# Patient Record
Sex: Female | Born: 1975 | Race: Black or African American | Hispanic: No | Marital: Single | Smoking: Never smoker
Health system: Southern US, Community
[De-identification: ages and names within clinical notes are randomized; demographics above are authoritative.]

## PROBLEM LIST (undated history)

## (undated) DIAGNOSIS — Z9889 Other specified postprocedural states: Secondary | ICD-10-CM

## (undated) DIAGNOSIS — O00109 Unspecified tubal pregnancy without intrauterine pregnancy: Secondary | ICD-10-CM

## (undated) DIAGNOSIS — R112 Nausea with vomiting, unspecified: Secondary | ICD-10-CM

## (undated) DIAGNOSIS — R7303 Prediabetes: Secondary | ICD-10-CM

## (undated) HISTORY — PX: TONSILLECTOMY: SUR1361

---

## 1997-07-30 DIAGNOSIS — J309 Allergic rhinitis, unspecified: Secondary | ICD-10-CM | POA: Insufficient documentation

## 1997-12-17 ENCOUNTER — Emergency Department (HOSPITAL_COMMUNITY): Admission: EM | Admit: 1997-12-17 | Discharge: 1997-12-17 | Payer: Self-pay | Admitting: Emergency Medicine

## 1998-03-12 ENCOUNTER — Other Ambulatory Visit: Admission: RE | Admit: 1998-03-12 | Discharge: 1998-03-12 | Payer: Self-pay | Admitting: Internal Medicine

## 1999-03-11 ENCOUNTER — Other Ambulatory Visit: Admission: RE | Admit: 1999-03-11 | Discharge: 1999-03-11 | Payer: Self-pay | Admitting: Family Medicine

## 1999-08-28 ENCOUNTER — Encounter: Payer: Self-pay | Admitting: *Deleted

## 1999-08-28 ENCOUNTER — Inpatient Hospital Stay (HOSPITAL_COMMUNITY): Admission: EM | Admit: 1999-08-28 | Discharge: 1999-08-28 | Payer: Self-pay | Admitting: *Deleted

## 1999-10-29 ENCOUNTER — Other Ambulatory Visit: Admission: RE | Admit: 1999-10-29 | Discharge: 1999-10-29 | Payer: Self-pay | Admitting: Obstetrics

## 2000-05-03 ENCOUNTER — Inpatient Hospital Stay (HOSPITAL_COMMUNITY): Admission: AD | Admit: 2000-05-03 | Discharge: 2000-05-05 | Payer: Self-pay | Admitting: Obstetrics

## 2001-06-26 ENCOUNTER — Other Ambulatory Visit: Admission: RE | Admit: 2001-06-26 | Discharge: 2001-06-26 | Payer: Self-pay | Admitting: Family Medicine

## 2003-02-07 ENCOUNTER — Other Ambulatory Visit: Admission: RE | Admit: 2003-02-07 | Discharge: 2003-02-07 | Payer: Self-pay | Admitting: Family Medicine

## 2004-05-28 ENCOUNTER — Emergency Department (HOSPITAL_COMMUNITY): Admission: EM | Admit: 2004-05-28 | Discharge: 2004-05-28 | Payer: Self-pay | Admitting: Emergency Medicine

## 2004-08-24 ENCOUNTER — Ambulatory Visit: Payer: Self-pay | Admitting: Internal Medicine

## 2004-08-31 ENCOUNTER — Ambulatory Visit: Payer: Self-pay | Admitting: Family Medicine

## 2004-08-31 ENCOUNTER — Encounter (INDEPENDENT_AMBULATORY_CARE_PROVIDER_SITE_OTHER): Payer: Self-pay | Admitting: Internal Medicine

## 2004-08-31 LAB — CONVERTED CEMR LAB: Pap Smear: NORMAL

## 2005-09-29 ENCOUNTER — Emergency Department (HOSPITAL_COMMUNITY): Admission: EM | Admit: 2005-09-29 | Discharge: 2005-09-29 | Payer: Self-pay | Admitting: Emergency Medicine

## 2006-01-19 ENCOUNTER — Inpatient Hospital Stay (HOSPITAL_COMMUNITY): Admission: AD | Admit: 2006-01-19 | Discharge: 2006-01-21 | Payer: Self-pay | Admitting: Obstetrics

## 2006-01-27 ENCOUNTER — Inpatient Hospital Stay (HOSPITAL_COMMUNITY): Admission: AD | Admit: 2006-01-27 | Discharge: 2006-01-27 | Payer: Self-pay | Admitting: Obstetrics

## 2006-01-31 ENCOUNTER — Inpatient Hospital Stay (HOSPITAL_COMMUNITY): Admission: AD | Admit: 2006-01-31 | Discharge: 2006-01-31 | Payer: Self-pay | Admitting: Obstetrics

## 2006-10-04 ENCOUNTER — Ambulatory Visit: Payer: Self-pay | Admitting: Internal Medicine

## 2007-03-03 ENCOUNTER — Telehealth (INDEPENDENT_AMBULATORY_CARE_PROVIDER_SITE_OTHER): Payer: Self-pay | Admitting: *Deleted

## 2007-03-07 ENCOUNTER — Encounter: Payer: Self-pay | Admitting: Internal Medicine

## 2008-01-08 ENCOUNTER — Emergency Department (HOSPITAL_COMMUNITY): Admission: EM | Admit: 2008-01-08 | Discharge: 2008-01-08 | Payer: Self-pay | Admitting: General Surgery

## 2008-03-20 ENCOUNTER — Emergency Department (HOSPITAL_COMMUNITY): Admission: EM | Admit: 2008-03-20 | Discharge: 2008-03-20 | Payer: Self-pay | Admitting: Emergency Medicine

## 2008-05-03 DIAGNOSIS — O00109 Unspecified tubal pregnancy without intrauterine pregnancy: Secondary | ICD-10-CM

## 2008-05-03 HISTORY — PX: ECTOPIC PREGNANCY SURGERY: SHX613

## 2008-05-03 HISTORY — DX: Unspecified tubal pregnancy without intrauterine pregnancy: O00.109

## 2009-01-20 ENCOUNTER — Encounter (INDEPENDENT_AMBULATORY_CARE_PROVIDER_SITE_OTHER): Payer: Self-pay | Admitting: Internal Medicine

## 2009-01-21 ENCOUNTER — Encounter: Payer: Self-pay | Admitting: Obstetrics & Gynecology

## 2009-01-21 ENCOUNTER — Encounter: Payer: Self-pay | Admitting: Emergency Medicine

## 2009-01-21 ENCOUNTER — Ambulatory Visit: Payer: Self-pay | Admitting: Obstetrics & Gynecology

## 2009-01-21 ENCOUNTER — Ambulatory Visit (HOSPITAL_COMMUNITY): Admission: AD | Admit: 2009-01-21 | Discharge: 2009-01-21 | Payer: Self-pay | Admitting: Obstetrics & Gynecology

## 2009-03-19 ENCOUNTER — Emergency Department (HOSPITAL_COMMUNITY): Admission: EM | Admit: 2009-03-19 | Discharge: 2009-03-20 | Payer: Self-pay | Admitting: Emergency Medicine

## 2009-03-24 ENCOUNTER — Emergency Department (HOSPITAL_COMMUNITY): Admission: EM | Admit: 2009-03-24 | Discharge: 2009-03-24 | Payer: Self-pay | Admitting: Emergency Medicine

## 2009-09-09 ENCOUNTER — Ambulatory Visit: Payer: Self-pay | Admitting: Physician Assistant

## 2009-09-12 ENCOUNTER — Telehealth: Payer: Self-pay | Admitting: Physician Assistant

## 2009-10-10 ENCOUNTER — Encounter: Payer: Self-pay | Admitting: Physician Assistant

## 2009-10-14 ENCOUNTER — Encounter: Payer: Self-pay | Admitting: Physician Assistant

## 2009-10-29 ENCOUNTER — Other Ambulatory Visit: Admission: RE | Admit: 2009-10-29 | Discharge: 2009-10-29 | Payer: Self-pay | Admitting: Internal Medicine

## 2009-10-29 ENCOUNTER — Ambulatory Visit: Payer: Self-pay | Admitting: Physician Assistant

## 2009-10-29 DIAGNOSIS — R82998 Other abnormal findings in urine: Secondary | ICD-10-CM | POA: Insufficient documentation

## 2009-10-29 LAB — CONVERTED CEMR LAB
Bilirubin Urine: NEGATIVE
Glucose, Urine, Semiquant: NEGATIVE
KOH Prep: NEGATIVE
Specific Gravity, Urine: 1.015
Whiff Test: NEGATIVE
pH: 5.5

## 2009-10-30 ENCOUNTER — Encounter: Payer: Self-pay | Admitting: Physician Assistant

## 2009-10-30 LAB — CONVERTED CEMR LAB
ALT: 21 units/L (ref 0–35)
AST: 21 units/L (ref 0–37)
Alkaline Phosphatase: 41 units/L (ref 39–117)
CO2: 24 meq/L (ref 19–32)
Casts: NONE SEEN /lpf
Chlamydia, DNA Probe: NEGATIVE
Creatinine, Ser: 0.71 mg/dL (ref 0.40–1.20)
Crystals: NONE SEEN
GC Probe Amp, Genital: NEGATIVE
Sodium: 140 meq/L (ref 135–145)
Total Bilirubin: 0.5 mg/dL (ref 0.3–1.2)
Total Protein: 6.9 g/dL (ref 6.0–8.3)

## 2009-11-11 DIAGNOSIS — R87610 Atypical squamous cells of undetermined significance on cytologic smear of cervix (ASC-US): Secondary | ICD-10-CM

## 2009-12-01 ENCOUNTER — Ambulatory Visit: Payer: Self-pay | Admitting: Physician Assistant

## 2009-12-01 LAB — CONVERTED CEMR LAB
Ketones, urine, test strip: NEGATIVE
Nitrite: NEGATIVE
Urobilinogen, UA: 0.2
WBC Urine, dipstick: NEGATIVE

## 2010-01-01 ENCOUNTER — Ambulatory Visit: Payer: Self-pay | Admitting: Physician Assistant

## 2010-01-01 ENCOUNTER — Other Ambulatory Visit: Admission: RE | Admit: 2010-01-01 | Discharge: 2010-01-01 | Payer: Self-pay | Admitting: Internal Medicine

## 2010-01-08 ENCOUNTER — Encounter: Payer: Self-pay | Admitting: Physician Assistant

## 2010-02-09 ENCOUNTER — Ambulatory Visit: Payer: Self-pay | Admitting: Physician Assistant

## 2010-02-09 DIAGNOSIS — J039 Acute tonsillitis, unspecified: Secondary | ICD-10-CM

## 2010-02-12 ENCOUNTER — Ambulatory Visit: Payer: Self-pay | Admitting: Physician Assistant

## 2010-03-04 ENCOUNTER — Ambulatory Visit: Payer: Self-pay | Admitting: Nurse Practitioner

## 2010-03-04 DIAGNOSIS — J029 Acute pharyngitis, unspecified: Secondary | ICD-10-CM

## 2010-03-04 DIAGNOSIS — G479 Sleep disorder, unspecified: Secondary | ICD-10-CM | POA: Insufficient documentation

## 2010-03-04 LAB — CONVERTED CEMR LAB: Rapid Strep: NEGATIVE

## 2010-03-20 ENCOUNTER — Ambulatory Visit: Payer: Self-pay | Admitting: Nurse Practitioner

## 2010-03-23 LAB — CONVERTED CEMR LAB
Eosinophils Relative: 3 % (ref 0–5)
HCT: 41.5 % (ref 36.0–46.0)
Hemoglobin: 14 g/dL (ref 12.0–15.0)
Lymphocytes Relative: 40 % (ref 12–46)
Lymphs Abs: 2.1 10*3/uL (ref 0.7–4.0)
MCV: 90.8 fL (ref 78.0–100.0)
Monocytes Absolute: 0.3 10*3/uL (ref 0.1–1.0)
RDW: 12.9 % (ref 11.5–15.5)
WBC: 5.3 10*3/uL (ref 4.0–10.5)

## 2010-06-04 NOTE — Miscellaneous (Signed)
Summary: Approved - Prior Authorization for Singulair  Clinical Lists Changes Approved prior authorization - Singulair 100 mg. #30 for one year.

## 2010-06-04 NOTE — Assessment & Plan Note (Signed)
Summary: Repeat Pap // tl   Vital Signs:  Patient profile:   35 year old female Height:      64 inches Weight:      150 pounds BMI:     25.84 Temp:     98.4 degrees F oral Pulse rate:   78 / minute Pulse rhythm:   regular Resp:     18 per minute BP sitting:   122 / 82  (left arm) Cuff size:   regular  Vitals Entered By: Armenia Shannon (January 01, 2010 11:13 AM) CC: repeat pap...Marland KitchenMarland KitchenMarland Kitchenpt wants to know why she keeps getting HPV... Pain Assessment Patient in pain? no       Does patient need assistance? Functional Status Self care Ambulation Normal   Primary Care Provider:  Tereso Newcomer, PA-C  CC:  repeat pap...Marland KitchenMarland KitchenMarland Kitchenpt wants to know why she keeps getting HPV....  History of Present Illness: Here for pap.  Had ASCUS.  But, no HPV test done.  Michele [name] woried she has HPV.  Reassurance today.   Problems Prior to Update: 1)  Pap Smear, Abnormal, Ascus  (ICD-795.01) 2)  Urinalysis, Abnormal  (ICD-791.9) 3)  Preventive Health Care  (ICD-V70.0) 4)  Contraceptive Management  (ICD-V25.09) 5)  Allergic Rhinitis  (ICD-477.9)  Current Medications (verified): 1)  Singulair 10 Mg  Tabs (Montelukast Sodium) .Marland Kitchen.. 1 Tab By Mouth Daily 2)  Flonase 50 Mcg/act Susp (Fluticasone Propionate) .... 2 Sprays Each Nostril Once Daily  Avoid Using For One Week Out of Every 4 Weeks. 3)  Zyrtec-D Allergy & Congestion 5-120 Mg Xr12h-Tab (Cetirizine-Pseudoephedrine) .... Take 1 Tablet By Mouth Two Times A Day As Needed For Allergies 4)  Ortho Evra 150-20 Mcg/24hr Ptwk (Norelgestromin-Eth Estradiol) .... Apply One Patch Per Week As Directed For 3 Weeks, Then One Week Off.  Allergies (verified): No Known Drug Allergies  Physical Exam  General:  alert, well-developed, and well-nourished.   Head:  normocephalic and atraumatic.   Genitalia:  normal introitus, no external lesions, no vaginal discharge, mucosa pink and moist, no vaginal or cervical lesions, no vaginal atrophy, no friaility or  hemorrhage, normal uterus size and position, and no adnexal masses or tenderness.   Neurologic:  alert & oriented X3 and cranial nerves II-XII intact.   Psych:  normally interactive.     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 1 tab by mouth daily 2)  Flonase 50 Mcg/act Susp (Fluticasone propionate) .... 2 sprays each nostril once daily  avoid using for one week out of every 4 weeks. 3)  Zyrtec-d Allergy & Congestion 5-120 Mg Xr12h-tab (Cetirizine-pseudoephedrine) .... Take 1 tablet by mouth two times a day as needed for allergies 4)  Ortho Evra 150-20 Mcg/24hr Ptwk (Norelgestromin-eth estradiol) .... Apply one patch per week as directed for 3 weeks, then one week off.  Patient Instructions: 1)  Please schedule a follow-up appointment in 1 year for CPP.

## 2010-06-04 NOTE — Progress Notes (Signed)
Summary: PA requirements for Singulair  Phone Note Outgoing Call   Summary of Call: Notify patient that I received request for PA on Singulair. I had advised her to try the zyrtec and nasal spray first. She should only try to fill the Singulair if the above does not work. I cannot get PA from Medicaid until she tries the above and notes that it does not work. Try the above for 4 weeks. Call if no better and I can try to get PA from Medicaid. Initial call taken by: Brynda Rim,  Sep 12, 2009 4:28 PM  Follow-up for Phone Call        Pt. advised of need to use Zyrtec and nasal spray for 4 weeks.  If ineffective, then try Singulair, advised of PA requirements.  C/o sore throat, instructed re increased fluid, saline spray and lozenges/cough drops for relief.  Dutch Quint RN  Sep 15, 2009 10:52 AM  Follow-up by: Dutch Quint RN,  Sep 15, 2009 10:52 AM     Appended Document: PA requirements for Singulair Left message on answering machine for pt. to return call re PA for Singulair.  Dutch Quint RN  October 13, 2009 10:06 AM   Pt. returned call. She continues to have problems with her allergies. Taking Zyrtec and Fluticasone with minimal relief. P.A. faxed Gaylyn Cheers RN  October 13, 2009 3:32 PM

## 2010-06-04 NOTE — Assessment & Plan Note (Signed)
Summary: Pharyngitis   Vital Signs:  Patient profile:   35 year old female LMP:     01/31/2010 Weight:      145.6 pounds BMI:     25.08 Temp:     97.4 degrees F oral Pulse rate:   70 / minute Pulse rhythm:   regular Resp:     16 per minute BP sitting:   100 / 64  (left arm) Cuff size:   regular  Vitals Entered By: Levon Hedger (March 04, 2010 11:01 AM)  Nutrition Counseling: Patient's BMI is greater than 25 and therefore counseled on weight management options. CC: sorethroat...has been treated with amoxicillin and still is not feeling any better, still hard to swallow Is Patient Diabetic? No Pain Assessment Patient in pain? yes     Location: throat Intensity: 3  Does patient need assistance? Functional Status Self care Ambulation Normal Comments pt states she is not taking any medcation. LMP (date): 01/31/2010     Enter LMP: 01/31/2010 Last PAP Result NEGATIVE FOR INTRAEPITHELIAL LESIONS OR MALIGNANCY.   Primary Care Provider:  Tereso Newcomer, PA-C  CC:  sorethroat...has been treated with amoxicillin and still is not feeling any better and still hard to swallow.  History of Present Illness:  Pt into the office with sore throat. She was seen on previous visit and she was treated with amoxil. Symptoms improved at that time and returned 3 nights ago. +difficulty eating and drinking +right ear with intermittent pain ? pain with wisdom tooth on right side -fever -cough symptoms worse when she drinks acidic juices such as orange juice  Family planning - pt states that she is not sexually active.  She did NOT start the birth control patch as ordered  Allergies (verified): No Known Drug Allergies  Review of Systems General:  Complains of sleep disorder; denies fever. ENT:  Complains of sore throat; denies ear discharge and nasal congestion. CV:  Denies chest pain or discomfort. Resp:  Denies cough. GI:  Denies abdominal pain, nausea, and  vomiting.  Physical Exam  General:  alert.   Head:  normocephalic.   Mouth:  tonsillar enlargment +2 pharynx pink and moist and fair dentition.   Lungs:  normal breath sounds.   Heart:  normal rate and regular rhythm.   Msk:  normal ROM.   Neurologic:  alert & oriented X3.     Impression & Recommendations:  Problem # 1:  PHARYNGITIS (ICD-462) rapid strep negative advised pt to start allergy meds take aleve as needed  start Nexium as symptoms may be from acid reflux (samples given) Orders: Rapid Strep ([address])  Problem # 2:  SLEEP DISORDER (ICD-780.50) advised benadryl or melatonin otc  Complete Medication List: 1)  Flonase 50 Mcg/act Susp (Fluticasone propionate) .... 2 sprays each nostril once daily  avoid using for one week out of every 4 weeks. 2)  Loratadine 10 Mg Tabs (Loratadine) .... One tablet by mouth daily 3)  Nexium 40 Mg Cpdr (Esomeprazole magnesium) .... One capsule by mouth before breakfast  Patient Instructions: 1)  Sore throat is most likely due to viral illness or acid reflux. 2)  Rapid strep is negative today so problem is not bacterial. 3)  Viral illness - Take Loratadine 10mg  by mouth daily  4)  Gargle with warm salt water 5)  Take advil or aleve as needed for swelling and pain 6)  Acid - read handout for foods to avoid. 7)  Take nexium 40mg  by mouth before breakfast (samples given)  8)  Follow up in 2-3 weeks to assess throat symptoms 9)  Will need flu vaccine Prescriptions: NEXIUM 40 MG CPDR (ESOMEPRAZOLE MAGNESIUM) One capsule by mouth before breakfast  #10 x 0   Entered and Authorized by:   Lehman Prom FNP   Signed by:   Lehman Prom FNP on 03/04/2010   Method used:   Samples Given   RxID:   [id number] LORATADINE 10 MG TABS (LORATADINE) One tablet by mouth daily  #30 x 1   Entered and Authorized by:   Lehman Prom FNP   Signed by:   Lehman Prom FNP on 03/04/2010   Method used:   Print then Give to Patient   RxID:    [id number]    Orders Added: 1)  Est. Patient Level III [84696] 2)  Rapid Strep [29528]    Laboratory Results  Date/Time Received: March 04, 2010 12:31 PM   Other Tests  Rapid Strep: negative

## 2010-06-04 NOTE — Letter (Signed)
Summary: *HSN Results Follow up  [facility]  [address] [place], [place] [address]   Phone: [phone]  Fax: [phone]      01/08/2010   Michele [name] [place] [address] [place], [place]  [address]   Dear  [name],                            ____S.Drinkard,FNP   ____D. Gore,FNP       ____B. McPherson,MD   ____V. Rankins,MD    ____E. Mulberry,MD    ____N. Daphine Deutscher, FNP  ____D. Reche Dixon, MD    ____K. Philipp Deputy, MD    __x__S. Alben Spittle, PA-C     This letter is to inform you that your recent test(s):  ___x____Pap Smear    _______Lab Test     _______X-ray    ___x____ is normal  _______ requires a medication change  _______ requires a follow-up lab visit  ___x____ requires a follow-up visit with your [name]   Comments: Repeat pap is normal.  So, you should have a repeat pap in one year.       _________________________________________________________ If you have any questions, please contact our office                     Sincerely,  Tereso Newcomer PA-C [facility]

## 2010-06-04 NOTE — Assessment & Plan Note (Signed)
Summary: CPP///KT   Vital Signs:  Patient profile:   35 year old female LMP:     10/17/2009 Weight:      148 pounds Temp:     97.9 degrees F oral Pulse rate:   58 / minute Pulse rhythm:   regular Resp:     18 per minute BP sitting:   96 / 64  (left arm) Cuff size:   regular  Vitals Entered By: Armenia Shannon (October 29, 2009 3:40 PM)  Primary Care Provider:  Tereso Newcomer, PA-C   History of Present Illness: Here for CPP.  Health maint: PHQ9=2 LMP 6.17.2011 Does have a h/o abnormal pap in past. No heavy bleeding or discharge. Wants to try the patch for [facility]. Has had a mammo. 2009. No FHx of breast, colon or ovarian cancer. Not sure why she had a mammo done in past.  No h/o lumps, etc. Does not take calcium.  Allergies:  Now on Singulair and Zyrtec D.  Doing well with less symptoms.   Problems Prior to Update: 1)  Urinalysis, Abnormal  (ICD-791.9) 2)  Preventive Health Care  (ICD-V70.0) 3)  Contraceptive Management  (ICD-V25.09) 4)  Allergic Rhinitis  (ICD-477.9)  Current Medications (verified): 1)  Singulair 10 Mg  Tabs (Montelukast Sodium) .Marland Kitchen.. 1 Tab By Mouth Daily 2)  Flonase 50 Mcg/act Susp (Fluticasone Propionate) .... 2 Sprays Each Nostril Once Daily  Avoid Using For One Week Out of Every 4 Weeks. 3)  Zyrtec-D Allergy & Congestion 5-120 Mg Xr12h-Tab (Cetirizine-Pseudoephedrine) .... Take 1 Tablet By Mouth Two Times A Day As Needed For Allergies  Allergies (verified): No Known Drug Allergies  Past History:  Past Medical History: Last updated: 03/07/2007 Current Problems:  CONTRACEPTIVE MANAGEMENT (ICD-V25.09) ALLERGIC RHINITIS (ICD-477.9)  Past Surgical History: Last updated: 09/09/2009 Caesarean section 2007 Ectopic Pregnancy s/p surgery 01/2009  Family History: Last updated: 10/29/2009 No breast, colon or ovarian cancer. 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 Kidney problems - mom CHF - mom CAD - mom had CABG in 47s Family History Hypertension  Review of Systems      See HPI General:  Denies chills and fever. CV:  Denies chest pain or discomfort and shortness of breath with exertion. Resp:  Denies cough. GI:  Denies bloody stools and dark tarry stools. GU:  Denies dysuria and urinary frequency. MS:  Denies joint pain. Derm:  Denies rash. Psych:  Denies depression. Endo:  Denies cold intolerance and heat intolerance. Heme:  Denies bleeding.  Physical Exam  General:  alert, well-developed, and well-nourished.   Head:  normocephalic and atraumatic.   Eyes:  pupils equal, pupils round, pupils reactive to light, and no optic disk abnormalities.   Ears:  R ear normal and L ear normal.   Nose:  no external deformity.   Mouth:  pharynx pink and moist.   Neck:  supple and no cervical lymphadenopathy.   Breasts:  skin/areolae normal, no masses, no abnormal thickening, no nipple discharge, no tenderness, and no adenopathy.   Lungs:  normal breath sounds, no crackles, and no wheezes.   Heart:  normal rate, regular rhythm, and no murmur.   Abdomen:  soft, non-tender, and no hepatomegaly.   Rectal:  no external abnormalities.   Genitalia:  normal introitus, no  external lesions, no vaginal discharge, mucosa pink and moist, no vaginal or cervical lesions, no vaginal atrophy, no friaility or hemorrhage, normal uterus size and position, and no adnexal masses or tenderness.   Msk:  normal ROM.   Pulses:  R posterior tibial normal, R dorsalis pedis normal, L posterior tibial normal, and L dorsalis pedis normal.   Extremities:  no edema Neurologic:  alert & oriented X3 and cranial nerves II-XII intact.   Skin:  turgor normal.   Psych:  normally interactive.     Impression &  Recommendations:  Problem # 1:  PREVENTIVE HEALTH CARE (ICD-V70.0)  Orders: KOH/ [place] [phone]) T-Pap Smear, Thin Prep [phone]) T- GC Chlamydia ([address]) T-HIV Antibody  (Reflex) [phone]) T-Syphilis Test (RPR) [phone]) UA Dipstick w/o Micro (manual) (81002) T-Comprehensive Metabolic Panel ([id number])  Problem # 2:  CONTRACEPTIVE MANAGEMENT (ICD-V25.09) was on Nuva Ring wants to try patch now explained to her that I would rec. she come off patch after she turns 34 due to increased risk of PE will have to discuss depo or lower dose estrogen pills at that time nonsmoker . . . may be able to use until 39  Problem # 3:  URINALYSIS, ABNORMAL (ICD-791.9)  Orders: T-Culture, Urine ([id number]) T- * Misc. Laboratory test [phone])  Complete Medication List: 1)  Singulair 10 Mg Tabs (Montelukast sodium) .Marland Kitchen.. 1 tab by mouth daily 2)  Flonase 50 Mcg/act Susp (Fluticasone propionate) .... 2 sprays each nostril once daily  avoid using for one week out of every 4 weeks. 3)  Zyrtec-d Allergy & Congestion 5-120 Mg Xr12h-tab (Cetirizine-pseudoephedrine) .... Take 1 tablet by mouth two times a day as needed for allergies 4)  Ortho Evra 150-20 Mcg/24hr Ptwk (Norelgestromin-eth estradiol) .... Apply one patch per week as directed for 3 weeks, then one week off.  Patient Instructions: 1)  Take Calcium 600 mg + Vitamin D 400 International Units two times a day. 2)  Start the patch the first Sunday after the first day of your next period.  If your period starts on a Saturday, start the patch the next day on Sunday.  If your period starts on Monday, start the patch the next Sunday. 3)  Wear for 3 weeks.  Change weekly.  Then, take a week off to have a cycle. 4)  Please schedule a follow-up appointment in 1 year with Lucresia Simic for CPP or sooner if needed. 5)  Schedule lab visit for fasting lipids. Prescriptions: ORTHO EVRA 150-20 MCG/24HR PTWK (NORELGESTROMIN-ETH ESTRADIOL) Apply one patch per  week as directed for 3 weeks, then one week off.  #1 mo supply x 11   Entered and Authorized by:   Tereso Newcomer PA-C   Signed by:   Tereso Newcomer PA-C on 10/29/2009   Method used:   Print then Give to Patient   RxID:   [id number]   Laboratory Results   Urine Tests    Routine Urinalysis   Glucose: negative   (Normal Range: Negative) Bilirubin: negative   (Normal Range: Negative) Ketone: negative   (Normal Range: Negative) Spec. Gravity: 1.015   (Normal Range: 1.003-1.035) Blood: trace-lysed   (Normal Range: Negative) pH: 5.5   (Normal Range: 5.0-8.0) Protein: negative   (Normal Range: Negative) Urobilinogen: 0.2   (Normal Range: 0-1) Nitrite: negative   (Normal Range: Negative) Leukocyte Esterace: trace   (Normal Range: Negative)      [place] Source: vaginal WBC/hpf: 1-5 Bacteria/hpf: rare Clue cells/hpf: none  Negative whiff Yeast/hpf: none [place] KOH:  Negative Trichomonas/hpf: none

## 2010-06-04 NOTE — Assessment & Plan Note (Signed)
SummaryBarbaraann Barthel PT//ALLERGIES   Vital Signs:  Patient profile:   35 year old female Weight:      151 pounds Temp:     97.4 degrees F oral Pulse rate:   71 / minute Pulse rhythm:   regular Resp:     18 per minute BP sitting:   116 / 80  (left arm) Cuff size:   regular  Vitals Entered By: Armenia Shannon (Sep 09, 2009 2:53 PM) CC: allergies,breathing issues/stoped-up,zertec and singular works together for the [employer] Is Patient Diabetic? No  Does patient need assistance? Functional Status Self care Ambulation Normal   Primary Care Provider:  Tereso Newcomer, PA-C  CC:  allergies, breathing issues/stoped-up, zertec and singular works together for the allergies, headaches, and itchy eyes.  History of Present Illness: Previous patient of Dr. Barbaraann Barthel . . . first meeting. Was seeing Dr. Gaynell Face after she had her baby in 2007.  Health maint: No pap a few years. No birth control at this time.  Used to have an IUD but was taken out when she had ectopic pregnancy.  Allergic rhinitis: C/o nasal congestion and drainage.  +itchy watery eyes; ocular discharge; + headaches. She was taking zyrtec and singulair recently.  She was getting from her aunt.  These seem to have helped the most.  No wheezing or dyspnea.    Habits & Providers  Alcohol-Tobacco-Diet     Tobacco Status: never  Exercise-Depression-Behavior     Drug Use: no  Allergies (verified): No Known Drug Allergies  Past History:  Past Medical History: Reviewed history from 03/07/2007 and no changes required. Current Problems:  CONTRACEPTIVE MANAGEMENT (ICD-V25.09) ALLERGIC RHINITIS (ICD-477.9)  Past Surgical History: Caesarean section 2007 Ectopic Pregnancy s/p surgery 01/2009  Family History: No breast, colon or ovarian cancer. 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 Resp:  Denies cough and wheezing.  Physical Exam  General:  alert, well-developed, and well-nourished.   Head:  normocephalic and atraumatic.   Eyes:  "allergic shiners" bilat pupils equal, pupils round, and pupils reactive to light.   Ears:  R ear normal and L ear normal.   Nose:  no external deformity and mucosal edema.   Mouth:  pharynx pink and moist.   Neck:  supple and no cervical lymphadenopathy.   Lungs:  normal breath sounds and no wheezes.   Heart:  normal rate and regular rhythm.   Neurologic:  alert & oriented X3 and cranial nerves II-XII intact.   Psych:  normally interactive.     Impression & Recommendations:  Problem # 1:  ALLERGIC RHINITIS (ICD-477.9) try flonase and zyrtec-D if no improvement ok to get singulair and add to regimen (rx given)  The following medications were removed from the medication list:    Allegra 180 Mg Tabs (Fexofenadine hcl) .Marland Kitchen... 1 tab by mouth once daily Her updated medication list for this problem includes:    Flonase 50 Mcg/act Susp (Fluticasone propionate) .Marland Kitchen... 2 sprays each nostril once daily  avoid using for one week out of every 4 weeks.  Problem # 2:  Preventive Health Care (ICD-V70.0) schedule CPP  Complete Medication List: 1)  Singulair 10 Mg Tabs (Montelukast sodium) .Marland Kitchen.. 1 tab by mouth daily 2)  Flonase 50  Mcg/act Susp (Fluticasone propionate) .... 2 sprays each nostril once daily  avoid using for one week out of every 4 weeks. 3)  Zyrtec-d Allergy & Congestion 5-120 Mg Xr12h-tab (Cetirizine-pseudoephedrine) .... Take 1 tablet by mouth two times a day as needed for allergies  Patient Instructions: 1)  Please schedule a follow-up appointment in 2 months with Ronni Osterberg for CPP.  Come fasting (nothing to eat or drink after midnight the night before except water). 2)  Start on the Flonase and  Zyrtec-D now.  In 3-4 weeks, if your allergies are not improved, go ahead and fill the Singulair and start that in addition to the other medicines.   3)  Take a week off of the Flonase every 4 weeks (3 weeks on and one week off). Prescriptions: SINGULAIR 10 MG  TABS (MONTELUKAST SODIUM) 1 tab by mouth daily  #30 x 5   Entered and Authorized by:   Tereso Newcomer PA-C   Signed by:   Tereso Newcomer PA-C on 09/09/2009   Method used:   Print then Give to Patient   RxID:   [id number] ZYRTEC-D ALLERGY & CONGESTION 5-120 MG XR12H-TAB (CETIRIZINE-PSEUDOEPHEDRINE) Take 1 tablet by mouth two times a day as needed for allergies  #60 x 5   Entered and Authorized by:   Tereso Newcomer PA-C   Signed by:   Tereso Newcomer PA-C on 09/09/2009   Method used:   Print then Give to Patient   RxID:   [id number] FLONASE 50 MCG/ACT SUSP (FLUTICASONE PROPIONATE) 2 sprays each nostril once daily  Avoid using for one week out of every 4 weeks.  #1 x 5   Entered and Authorized by:   Tereso Newcomer PA-C   Signed by:   Tereso Newcomer PA-C on 09/09/2009   Method used:   Print then Give to Patient   RxID:   [id number]

## 2010-06-04 NOTE — Letter (Signed)
Summary: Handout Printed  Printed Handout:  - Human Papilloma Virus, (HPV)

## 2010-06-04 NOTE — Assessment & Plan Note (Signed)
Summary: RECHECK TROAT PER P. Merissa Renwick / NS   Vital Signs:  Patient profile:   35 year old female Pulse rate:   68 / minute Pulse rhythm:   regular Resp:     20 per minute BP sitting:   108 / 78  (right arm) Cuff size:   regular  Vitals Entered By: Dutch Quint RN (February 12, 2010 2:39 PM) CC: F/U tonsillitis Pain Assessment Patient in pain? no       Does patient need assistance? Functional Status Self care Ambulation Normal   Primary Care Provider:  Tereso Newcomer, PA-C  CC:  F/U tonsillitis.  History of Present Illness: In office 02/09/10 with symptoms of trouble swallowing, headache, aching, sore throat.  Dx tonsillitis.  Here for a f/u.  Still taking antibiotic and using magic mouthwash, no adverse effects except for some diarrhea, now resolved.  States she is currently not taking her allergy medications.  Allergies: No Known Drug Allergies  Review of Systems ENT:  Complains of difficulty swallowing, hoarseness, and sore throat; Feels like her throat is "hanging up" mucus.  Throat has been irritated at night, but not last night.. Resp:  Complains of cough; denies chest discomfort, chest pain with inspiration, and sputum productive.  Physical Exam  Mouth:  Bilateral tonsils still swollen, uvula deviated to the left tonsil.no exudates and pharyngeal erythema.   Lungs:  normal respiratory effort, no crackles, and R wheezes in upper posterior lobes.  States wheeze has been going on for a long time. Heart:  normal rate and regular rhythm.     Impression & Recommendations:  Problem # 1:  ACUTE TONSILLITIS (ICD-463) Seen by Wende Mott  Throat looks better, tonsils still enlarged To continue antibiotics until completed, magic mouthwash as needed Return if symptoms persist or worsen  Complete Medication List: 1)  Singulair 10 Mg Tabs (Montelukast sodium) .Marland Kitchen.. 1 tab by mouth daily 2)  Flonase 50 Mcg/act Susp (Fluticasone propionate) .... 2 sprays each nostril once daily   avoid using for one week out of every 4 weeks. 3)  Zyrtec-d Allergy & Congestion 5-120 Mg Xr12h-tab (Cetirizine-pseudoephedrine) .... Take 1 tablet by mouth two times a day as needed for allergies 4)  Ortho Evra 150-20 Mcg/24hr Ptwk (Norelgestromin-eth estradiol) .... Apply one patch per week as directed for 3 weeks, then one week off. 5)  Augmentin 875-125 Mg Tabs (Amoxicillin-pot clavulanate) .... Take 1 tablet by mouth two times a day for 7 days 6)  Magic Mouthwash  .... Swish and spit 5 ml every 4-6 hours as needed for sore throat 7)  Ibuprofen 800 Mg Tabs (Ibuprofen) .... Take 1 tablet by mouth three times a day with food for 3-4 days, then take as needed  Patient Instructions: 1)  Seen by Wende Mott 2)  Your throat looks much better. 3)  Complete antibiotics, even if you feel better. 4)  Continue to use magic mouthwash for comfort, as needed. 5)  If you feel like your symptoms are coming back, call for an appointment. 6)  Call if anything changes or if  you have any questions.   As above Tonsil size is down Redness almost resolved Much improved. Tereso Newcomer PA-C  February 13, 2010 3:35 PM

## 2010-06-04 NOTE — Assessment & Plan Note (Signed)
Summary: Tonsillitis   Vital Signs:  Patient profile:   35 year old female Height:      64 inches Weight:      146.5 pounds BMI:     25.24 Temp:     98.9 degrees F oral Pulse rate:   76 / minute Pulse rhythm:   regular Resp:     16 per minute BP sitting:   130 / 90  (left arm) Cuff size:   regular  Vitals Entered By: [place] Shannon (February 09, 2010 10:30 AM) CC: pt is here for sore throat.... Is Patient Diabetic? No Pain Assessment Patient in pain? no       Does patient need assistance? Functional Status Self care Ambulation Normal   Primary Care Provider:  Tereso Newcomer, PA-C  CC:  pt is here for sore throat.....  History of Present Illness: Sore throat since Sat. at 4pm.  + headache.  + myalgias.  Theraflu helped.  Symptoms started again yesterday morning.  No fever.  No cough.  Daughter said she felt hot.  Temp 98.1.  + chills.  + right otalgia. No n/v or diarrhea.  No chest pain or sob.    Problems Prior to Update: 1)  Acute Tonsillitis  (ICD-463) 2)  Pap Smear, Abnormal, Ascus  (ICD-795.01) 3)  Urinalysis, Abnormal  (ICD-791.9) 4)  Preventive Health Care  (ICD-V70.0) 5)  Contraceptive Management  (ICD-V25.09) 6)  Allergic Rhinitis  (ICD-477.9)  Current Medications (verified): 1)  Singulair 10 Mg  Tabs (Montelukast Sodium) .Marland Kitchen.. 1 Tab By Mouth Daily 2)  Flonase 50 Mcg/act Susp (Fluticasone Propionate) .... 2 Sprays Each Nostril Once Daily  Avoid Using For One Week Out of Every 4 Weeks. 3)  Zyrtec-D Allergy & Congestion 5-120 Mg Xr12h-Tab (Cetirizine-Pseudoephedrine) .... Take 1 Tablet By Mouth Two Times A Day As Needed For Allergies 4)  Ortho Evra 150-20 Mcg/24hr Ptwk (Norelgestromin-Eth Estradiol) .... Apply One Patch Per Week As Directed For 3 Weeks, Then One Week Off.  Allergies (verified): No Known Drug Allergies  Past History:  Past Medical History: Last updated: 03/07/2007 Current Problems:  CONTRACEPTIVE MANAGEMENT (ICD-V25.09) ALLERGIC RHINITIS  (ICD-477.9)  Physical Exam  General:  alert, well-developed, and well-nourished.   Head:  normocephalic and atraumatic.   Eyes:  pupils equal, pupils round, pupils reactive to light, and no injection.   Ears:  R ear normal and L ear normal.   Nose:  no external deformity.   Mouth:  tonsils 2+ bilat bilat tonsils erythematous . . . no exudate Neck:  very mild enlargement of post auricular nodes bilat Lungs:  normal breath sounds, no crackles, and no wheezes.   Heart:  normal rate and regular rhythm.   Neurologic:  alert & oriented X3 and cranial nerves II-XII intact.   Psych:  normally interactive.     Impression & Recommendations:  Problem # 1:  ACUTE TONSILLITIS (ICD-463)  tx with Augmentin x 7 days magic mouthwash ibuprofen three times a day x 3-4 days f/u later this week to check throat  Orders: Rapid Strep ([address]109)  Complete Medication List: 1)  Singulair 10 Mg Tabs (Montelukast sodium) .Marland Kitchen.. 1 tab by mouth daily 2)  Flonase 50 Mcg/act Susp (Fluticasone propionate) .... 2 sprays each nostril once daily  avoid using for one week out of every 4 weeks. 3)  Zyrtec-d Allergy & Congestion 5-120 Mg Xr12h-tab (Cetirizine-pseudoephedrine) .... Take 1 tablet by mouth two times a day as needed for allergies 4)  Ortho Evra 150-20 Mcg/24hr Ptwk (Norelgestromin-eth  estradiol) .... Apply one patch per week as directed for 3 weeks, then one week off. 5)  Augmentin 875-125 Mg Tabs (Amoxicillin-pot clavulanate) .... Take 1 tablet by mouth two times a day for 7 days 6)  Magic Mouthwash  .... Swish and spit 5 ml every 4-6 hours as needed for sore throat 7)  Ibuprofen 800 Mg Tabs (Ibuprofen) .... Take 1 tablet by mouth three times a day with food for 3-4 days, then take as needed  Patient Instructions: 1)  Take Augmentin until all gone. 2)  This will make your birth control pill ineffective (you could get pregnant).  You will need an alternate form of birth control until your next cycle starts.   3)  Use the Magic Mouthwash as needed for pain. 4)  Take the ibuprofen three times a day with food for 3-4 days straight.   5)  Stick to mainly liquids except for when you need to take your medicines.   6)  The prescriptions have been sent to the [facility]. 7)  Schedule Triage Nurse visit on Thursday or Friday of this week to recheck your throat.  Provider needs to see patient when she is here. 8)  Return sooner if feeling worse. 9)  Do not return to work until Wednesday, Oct 12. Prescriptions: IBUPROFEN 800 MG TABS (IBUPROFEN) Take 1 tablet by mouth three times a day with food for 3-4 days, then take as needed  #30 x 0   Entered and Authorized by:   Tereso Newcomer PA-C   Signed by:   Tereso Newcomer PA-C on 02/09/2010   Method used:   Faxed to ...       [facility] (retail)       [address] [place], [place]  [address]       Ph: [phone]       Fax: [phone]   RxID:   [id number] MAGIC MOUTHWASH swish and spit 5 mL every 4-6 hours as needed for sore throat  #100 mL x 0   Entered and Authorized by:   Tereso Newcomer PA-C   Signed by:   Tereso Newcomer PA-C on 02/09/2010   Method used:   Faxed to ...       [facility] (retail)       [address] [place], [place]  [address]       Ph: [phone]       Fax: [phone]   RxID:   [id number] AUGMENTIN 875-125 MG TABS (AMOXICILLIN-POT CLAVULANATE) Take 1 tablet by mouth two times a day for 7 days  #14 x 0   Entered and Authorized by:   Tereso Newcomer PA-C   Signed by:   Tereso Newcomer PA-C on 02/09/2010   Method used:   Faxed to ...       [facility] - [facility] (retail)       [address] [place], [place]  [address]       Ph: [phone]       Fax: [phone]   RxID:   [id number]

## 2010-06-04 NOTE — Progress Notes (Signed)
Summary: Office Visit//depression screening  Office Visit//depression screening   Imported By: Arta Bruce 11/04/2009 14:45:40  _____________________________________________________________________  External Attachment:    Type:   Image     Comment:   External Document

## 2010-06-04 NOTE — Medication Information (Signed)
Summary: RX Folder//SINGULAIR//APPROVED  RX Folder//SINGULAIR//APPROVED   Imported By: Arta Bruce 11/11/2009 15:10:13  _____________________________________________________________________  External Attachment:    Type:   Image     Comment:   External Document

## 2010-06-04 NOTE — Assessment & Plan Note (Signed)
Summary: F/U Sore throat   Vital Signs:  Michele [name] profile:   35 year old female Weight:      142.3 pounds BMI:     24.51 Temp:     97.6 degrees F oral Pulse rate:   72 / minute Pulse rhythm:   regular Resp:     16 per minute BP sitting:   100 / 66  (left arm) Cuff size:   regular  Vitals Entered By: Levon Hedger (March 20, 2010 12:22 PM) CC: follow-up visit sorethroat....pt states no pain but her throat still feels swollen, itchy, and irritated Is Michele [name] Diabetic? No Pain Assessment Michele [name] in pain? no       Does Michele [name] need assistance? Functional Status Self care Ambulation Normal   Primary Care Provider:  Tereso Newcomer, PA-C  CC:  follow-up visit sorethroat....pt states no pain but her throat still feels swollen, itchy, and and irritated.  History of Present Illness:  Pt into the office for f/u on sore throat. Educated on last visit about pharyngitis.  GERD - Pt reports that she has tried to decrease the irritating food and took the nexium as ordered.  Allergic Rhinitis - she has also taken the loratadine as ordered.  Pt still has question about causes for sore thoat -nausea -vomiting -exudate on tonsils -fever    Allergies (verified): No Known Drug Allergies  Review of Systems General:  Denies fever. ENT:  Complains of sore throat; denies earache; symptoms have improved. CV:  Denies fatigue. Resp:  Denies cough.  Physical Exam  General:  alert.   Head:  normocephalic.   Mouth:  tonsillar enlargement +2 no exudate uvula midline Lungs:  normal breath sounds.   Heart:  normal rate and regular rhythm.   Abdomen:  normal bowel sounds.   Neurologic:  alert & oriented X3.     Impression & Recommendations:  Problem # 1:  PHARYNGITIS (ICD-462) reviewed with pt advised to continue symptomatic management  Orders: T-CBC w/Diff ([id number])  Problem # 2:  ALLERGIC RHINITIS (ICD-477.9) continue current meds Her updated medication list for  this problem includes:    Flonase 50 Mcg/act Susp (Fluticasone propionate) .Marland Kitchen... 2 sprays each nostril once daily  avoid using for one week out of every 4 weeks.    Loratadine 10 Mg Tabs (Loratadine) ..... One tablet by mouth daily  Complete Medication List: 1)  Flonase 50 Mcg/act Susp (Fluticasone propionate) .... 2 sprays each nostril once daily  avoid using for one week out of every 4 weeks. 2)  Loratadine 10 Mg Tabs (Loratadine) .... One tablet by mouth daily 3)  Nexium 40 Mg Cpdr (Esomeprazole magnesium) .... One capsule by mouth before breakfast   Michele [name] Instructions: 1)  You have declined the flu vaccine today.  If you change your mind then you can schedule a visit with the triage nurse for the injection 2)  Continue current treatment for throat 3)  May need referral to ENT 4)  You will be notified of the lab results on Monday. If your white count is elevated then you will need antibiotics and it will be called into the pharmacy   Orders Added: 1)  Est. Michele [name] Level III [33295] 2)  T-CBC w/Diff [[id number]]    Prevention & Chronic Care Immunizations   Influenza vaccine: Refused  (03/20/2010)   Influenza vaccine deferral: Refused  (03/20/2010)    Tetanus booster: Not documented    Pneumococcal vaccine: Not documented  Other Screening   Pap smear: NEGATIVE FOR INTRAEPITHELIAL LESIONS  OR MALIGNANCY.  (01/01/2010)   Smoking status: never  (09/09/2009)

## 2010-08-07 LAB — CBC
HCT: 36.9 % (ref 36.0–46.0)
HCT: 38.5 % (ref 36.0–46.0)
Hemoglobin: 12.4 g/dL (ref 12.0–15.0)
Hemoglobin: 12.9 g/dL (ref 12.0–15.0)
MCHC: 33.6 g/dL (ref 30.0–36.0)
MCHC: 33.5 g/dL (ref 30.0–36.0)
MCV: 93.2 fL (ref 78.0–100.0)
MCV: 94.2 fL (ref 78.0–100.0)
Platelets: 244 K/uL (ref 150–400)
Platelets: 262 K/uL (ref 150–400)
RBC: 3.96 MIL/uL (ref 3.87–5.11)
RBC: 4.09 MIL/uL (ref 3.87–5.11)
RDW: 13.5 % (ref 11.5–15.5)
RDW: 13.4 % (ref 11.5–15.5)
WBC: 9.6 K/uL (ref 4.0–10.5)
WBC: 10.2 K/uL (ref 4.0–10.5)

## 2010-08-07 LAB — BASIC METABOLIC PANEL WITH GFR
BUN: 8 mg/dL (ref 6–23)
CO2: 26 meq/L (ref 19–32)
Calcium: 9 mg/dL (ref 8.4–10.5)
Chloride: 108 meq/L (ref 96–112)
Creatinine, Ser: 0.63 mg/dL (ref 0.4–1.2)
GFR calc non Af Amer: >60 mL/min
Glucose, Bld: 102 mg/dL — ABNORMAL HIGH (ref 70–99)
Potassium: 3.7 meq/L (ref 3.5–5.1)
Sodium: 140 meq/L (ref 135–145)

## 2010-08-07 LAB — DIFFERENTIAL
Basophils Relative: 0 % (ref 0–1)
Eosinophils Absolute: 0.1 10*3/uL (ref 0.0–0.7)
Monocytes Relative: 4 % (ref 3–12)
Neutrophils Relative %: 80 % — ABNORMAL HIGH (ref 43–77)

## 2010-08-07 LAB — POCT PREGNANCY, URINE: Preg Test, Ur: POSITIVE

## 2010-08-07 LAB — HCG, QUANTITATIVE, PREGNANCY

## 2010-08-07 LAB — TYPE AND SCREEN: Antibody Screen: NEGATIVE

## 2010-09-18 NOTE — Discharge Summary (Signed)
NAMECATRENA, VARI NO.:  [id number]   MEDICAL RECORD NO.:  [id number]          PATIENT TYPE:  INP   LOCATION:  9141                          FACILITY:  [facility]   PHYSICIAN:  Kathreen Cosier, M.D.DATE OF BIRTH:  12-Oct-1975   DATE OF ADMISSION:  01/19/2006  DATE OF DISCHARGE:  01/21/2006                                 DISCHARGE SUMMARY   The patient is a 35 year old gravida 4, para 3-0-0-3, [facility] February 06, 2006,  admitted with ruptured membranes.  She was 4 cm dilated with a breech  presentation.  The patient underwent low transverse cesarean section on  September 19.  She had a female, Apgars 8 and 9, weight 5 pounds 15 ounces.  The fluid was clear.  Postoperatively, the patient's hemoglobin was 8.1.  She was asymptomatic.  Her RPR was negative.  Platelets 172.  Urinalysis was  negative.  The patient desired early discharge and was discharged on the  second postoperative day on a regular diet and Tylox for pain.  The  remainder of her labs can be found in her prenatal record.  The patient is  to see me in six weeks.   DISCHARGE DIAGNOSIS:  Status post primary low transverse cesarean section  because of premature rupture of membranes, breech presentation, and labor.           ______________________________  Kathreen Cosier, M.D.     BAM/MEDQ  D:  02/09/2006  T:  02/10/2006  Job:  [id number]

## 2011-02-02 ENCOUNTER — Emergency Department (HOSPITAL_COMMUNITY)
Admission: EM | Admit: 2011-02-02 | Discharge: 2011-02-03 | Disposition: A | Discharge status: Home / Self Care | Payer: Medicaid Other | Attending: Emergency Medicine | Admitting: Emergency Medicine

## 2011-02-02 DIAGNOSIS — R209 Unspecified disturbances of skin sensation: Secondary | ICD-10-CM | POA: Insufficient documentation

## 2011-02-02 DIAGNOSIS — R51 Headache: Secondary | ICD-10-CM | POA: Insufficient documentation

## 2011-02-02 DIAGNOSIS — S139XXA Sprain of joints and ligaments of unspecified parts of neck, initial encounter: Secondary | ICD-10-CM | POA: Insufficient documentation

## 2011-02-02 DIAGNOSIS — M542 Cervicalgia: Secondary | ICD-10-CM | POA: Insufficient documentation

## 2011-02-03 ENCOUNTER — Emergency Department (HOSPITAL_COMMUNITY): Payer: Medicaid Other

## 2011-04-15 ENCOUNTER — Encounter (HOSPITAL_COMMUNITY): Payer: Self-pay | Admitting: *Deleted

## 2011-04-15 ENCOUNTER — Inpatient Hospital Stay (HOSPITAL_COMMUNITY)
Admission: AD | Admit: 2011-04-15 | Discharge: 2011-04-15 | Disposition: A | Discharge status: Home / Self Care | Payer: Medicaid Other | Source: Ambulatory Visit | Attending: Obstetrics & Gynecology | Admitting: Obstetrics & Gynecology

## 2011-04-15 DIAGNOSIS — A599 Trichomoniasis, unspecified: Secondary | ICD-10-CM

## 2011-04-15 DIAGNOSIS — A5901 Trichomonal vulvovaginitis: Secondary | ICD-10-CM | POA: Insufficient documentation

## 2011-04-15 DIAGNOSIS — R109 Unspecified abdominal pain: Secondary | ICD-10-CM | POA: Insufficient documentation

## 2011-04-15 HISTORY — DX: Unspecified tubal pregnancy without intrauterine pregnancy: O00.109

## 2011-04-15 LAB — WET PREP, GENITAL: Yeast Wet Prep HPF POC: NONE SEEN

## 2011-04-15 LAB — HCG, SERUM, QUALITATIVE: Preg, Serum: NEGATIVE

## 2011-04-15 LAB — CBC
MCH: 31.5 pg (ref 26.0–34.0)
MCHC: 34.4 g/dL (ref 30.0–36.0)
Platelets: 307 10*3/uL (ref 150–400)
RDW: 13.6 % (ref 11.5–15.5)

## 2011-04-15 LAB — URINALYSIS, ROUTINE W REFLEX MICROSCOPIC
Glucose, UA: NEGATIVE mg/dL
Ketones, ur: NEGATIVE mg/dL
Leukocytes, UA: NEGATIVE
pH: 5 (ref 5.0–8.0)

## 2011-04-15 LAB — POCT PREGNANCY, URINE: Preg Test, Ur: NEGATIVE

## 2011-04-15 MED ORDER — METRONIDAZOLE 500 MG PO TABS
2000.0000 mg | ORAL_TABLET | Freq: Once | ORAL | Status: AC
  Administered 2011-04-15: 2000 mg via ORAL
  Filled 2011-04-15: qty 4

## 2011-04-15 MED ORDER — IBUPROFEN 400 MG PO TABS
400.0000 mg | ORAL_TABLET | Freq: Once | ORAL | Status: AC
  Administered 2011-04-15: 400 mg via ORAL
  Filled 2011-04-15: qty 1

## 2011-04-15 NOTE — Progress Notes (Signed)
LMP Nov 7 x 3 days, 3 UPT at home neg, LLQ abd pain, and back pain,

## 2011-04-15 NOTE — Progress Notes (Signed)
Lower abd pain and back pain, 3 UPT negative at home, no periods

## 2011-08-19 ENCOUNTER — Emergency Department (HOSPITAL_COMMUNITY)
Admission: EM | Admit: 2011-08-19 | Discharge: 2011-08-19 | Disposition: A | Discharge status: Home / Self Care | Payer: Medicaid Other | Source: Home / Self Care | Attending: Emergency Medicine | Admitting: Emergency Medicine

## 2011-08-19 ENCOUNTER — Encounter (HOSPITAL_COMMUNITY): Payer: Self-pay | Admitting: Emergency Medicine

## 2011-08-19 DIAGNOSIS — J40 Bronchitis, not specified as acute or chronic: Secondary | ICD-10-CM

## 2011-08-19 MED ORDER — AZITHROMYCIN 250 MG PO TABS: 250.0000 mg | ORAL_TABLET | Freq: Every day | ORAL | Status: AC

## 2011-08-19 MED ORDER — GUAIFENESIN-CODEINE 100-10 MG/5ML PO SYRP: 5.0000 mL | ORAL_SOLUTION | Freq: Three times a day (TID) | ORAL | Status: AC | PRN

## 2011-08-19 NOTE — Discharge Instructions (Signed)
Bronchitis Bronchitis is a problem of the air tubes leading to your lungs. This problem makes it hard for air to get in and out of the lungs. You may cough a lot because your air tubes are narrow. Going without care can cause lasting (chronic) bronchitis. HOME CARE   Drink enough fluids to keep your pee (urine) clear or pale yellow.   Use a cool mist humidifier.   Quit smoking if you smoke. If you keep smoking, the bronchitis might not get better.   Only take medicine as told by your doctor.  GET HELP RIGHT AWAY IF:   Coughing keeps you awake.   You start to wheeze.   You become more sick or weak.   You have a hard time breathing or get short of breath.   You cough up blood.   Coughing lasts more than 2 weeks.   You have a fever.   Your baby is older than 3 months with a rectal temperature of 102 F (38.9 C) or higher.   Your baby is 3 months old or younger with a rectal temperature of 100.4 F (38 C) or higher.  MAKE SURE YOU:  Understand these instructions.   Will watch your condition.   Will get help right away if you are not doing well or get worse.  Document Released: 10/06/2007 Document Revised: 04/08/2011 Document Reviewed: 03/21/2009 ExitCare Patient Information 2012 [employer]. 

## 2011-08-19 NOTE — ED Provider Notes (Signed)
History     CSN: [id number]  Arrival date & time 08/19/11  1046   First MD Initiated Contact with Patient 08/19/11 1143      Chief Complaint  Patient presents with  . URI  . Bronchitis    (Consider location/radiation/quality/duration/timing/severity/associated sxs/prior treatment) HPI Comments: Having coughing for almost 2 weeks now specially at night and does and let you sleep occasionally have bring up phlegm the color greenish to yellowish and sometimes is just ride it is making my chest hurt. Take a deep breath also make my chest hurt. To have some mild congestion of my nose and stuffiness. In some sore throat. Have been taken multiple over-the-counter medicines and her Mucinex does seem to help may loosen up my chest phlegm but doesn't get rid of the cough. No wheezing and no shortness of breath at rest.  Patient is a 36 y.o. female presenting with URI. The history is provided by the patient.  URI The primary symptoms include sore throat, cough and abdominal pain. Primary symptoms do not include fever, wheezing or rash. The current episode started more than 1 week ago. This is a new problem. The problem has not changed since onset. The illness is not associated with chills.    Past Medical History  Diagnosis Date  . Ectopic pregnancy, tubal 2010    Past Surgical History  Procedure Date  . Ectopic pregnancy surgery 2010  . Cesarean section     Family History  Problem Relation Age of Onset  . Anesthesia problems Neg Hx     History  Substance Use Topics  . Smoking status: Never Smoker   . Smokeless tobacco: Never Used  . Alcohol Use: No    OB History    Grav Para Term Preterm Abortions TAB SAB Ect Mult Living   6 4 4  0 2 0 1 1 0 4      Review of Systems  Constitutional: Negative for fever, chills and activity change.  HENT: Positive for sore throat. Negative for neck pain and neck stiffness.   Eyes: Negative for itching.  Respiratory: Positive for cough.  Negative for chest tightness, shortness of breath and wheezing.   Gastrointestinal: Positive for abdominal pain.  Musculoskeletal: Negative for back pain.  Skin: Negative for color change and rash.    Allergies  Review of patient's allergies indicates no known allergies.  Home Medications   Current Outpatient Rx  Name Route Sig Dispense Refill  . AZITHROMYCIN 250 MG PO TABS Oral Take 1 tablet (250 mg total) by mouth daily. Take first 2 tablets together, then 1 every day until finished. 6 tablet 0  . GUAIFENESIN-CODEINE 100-10 MG/5ML PO SYRP Oral Take 5 mLs by mouth 3 (three) times daily as needed for cough. 120 mL 0    LMP 08/07/2011  Physical Exam  Nursing note and vitals reviewed. Constitutional: She appears well-developed and well-nourished.  Non-toxic appearance. She does not have a sickly appearance. She does not appear ill. No distress.  HENT:  Head: Normocephalic.  Right Ear: Tympanic membrane normal.  Left Ear: Tympanic membrane normal.  Nose: Nose normal.  Mouth/Throat: Mucous membranes are normal. Posterior oropharyngeal erythema present. No oropharyngeal exudate, posterior oropharyngeal edema or tonsillar abscesses.  Eyes: Conjunctivae are normal.  Neck: Normal range of motion. Neck supple.  Pulmonary/Chest: Effort normal and breath sounds normal. No respiratory distress. She has no decreased breath sounds. She has no wheezes. She has no rales. She exhibits no tenderness.  Abdominal: Soft.  Lymphadenopathy:  She has no cervical adenopathy.  Skin: No rash noted.    ED Course  Procedures (including critical care time)  Labs Reviewed - No data to display No results found.   1. Bronchitis       MDM  Patient with productive cough. For less than 2 weeks. Patient in no respiratory distress with isolated dry cough during exam. Good air movement and looks comfortable at rest. Afebrile. Recommended cough suppressant and macrolide coverage. Encouraged patient to  return in 5-7 days if no improvement is noted for [place] exam and perhaps x-rays if necessary or indicated. Patient agree with treatment plan and followup care as necessary        Jimmie Molly, MD 08/19/11 1251

## 2011-08-19 NOTE — ED Notes (Signed)
PT HERE WITH URI THAT STARTED X 2 WEEKS AGO BUT HAS WORSENED WITH  RIGHT CHEST PRESSURE AND DECREASE IN APPETITE.AFEBRILE.DENIES N/V/D.PT HAS BEEN TAKING OTC MUCINEX,AND COLD/COUGH MEDS BUT NO RELIEF

## 2011-08-31 ENCOUNTER — Telehealth (HOSPITAL_COMMUNITY): Payer: Self-pay | Admitting: *Deleted

## 2011-08-31 NOTE — ED Notes (Signed)
1210 Pt. called and said she had bronchitis and is still coughing. She asked what she should do?  I told her bronchitis can last 4-6 weeks.  The doctors usually recommend you come back if fever, SOB, chest pain or wheezing. I asked if she finished all of her medication. She said she has, but the cough medicine did not help at all. She told me she was on Robitussin with Codeine. I told her she can use Delsym for cough or Mucinex. She said she is taking Mucinex.  She said she does feel better, is up and moving around now.  Cough is worse at night. Cough prod. of clear sputum. No fever.  I told her would call her back. Discussed with Dr. Ladon Applebaum.  He said she should come back for a recheck, due to cough for almost 4 weeks. She may need a CXR.   I called pt. and told her what Dr. Ladon Applebaum recommended. Pt. voiced understanding. Vassie Moselle 08/31/2011

## 2012-04-27 ENCOUNTER — Encounter: Payer: Self-pay | Admitting: Obstetrics & Gynecology

## 2012-04-27 ENCOUNTER — Ambulatory Visit (INDEPENDENT_AMBULATORY_CARE_PROVIDER_SITE_OTHER): Payer: Medicaid Other | Admitting: Obstetrics & Gynecology

## 2012-04-27 VITALS — BP 103/72 | HR 63 | Temp 97.1°F | Ht 61.5 in | Wt 156.4 lb

## 2012-04-27 DIAGNOSIS — N871 Moderate cervical dysplasia: Secondary | ICD-10-CM

## 2012-04-27 NOTE — Patient Instructions (Signed)
Loop Electrosurgical Excision Procedure Loop electrosurgical excision procedure (LEEP) is the removal of a portion of the lower part of the uterus (cervix). The procedure is done when there are significantly abnormal cervical cell changes. Abnormal cell changes of the cervix can lead to cancer if left in place and untreated.  The LEEP procedure itself typically only takes a few minutes. Often, it may be done in your caregiver's office. The procedure is considered safe for those who wish to get pregnant or are trying to get pregnant. Only under rare circumstances should this procedure be done if you are pregnant. LET YOUR CAREGIVER KNOW ABOUT:  Whether you are pregnant or late for your last menstrual period.  Allergies to foods or medicines.  All the medicines you are taking includingherbs, eyedrops, and over-the-counter medicines, and creams.  Use of steroids (by mouth or creams).  Previous problems with anesthetics or numbing medicine.  Previous gynecological surgery.  History of blood clots or bleeding problems.  Any recent or current vaginal infections (herpes, sexually transmitted infections).  Other health problems. RISKS AND COMPLICATIONS  Bleeding.  Infection.  Injury to the vagina, bladder, or rectum.  Very rare obstruction of the cervical opening that causes problems during menstruation (cervical stenosis). BEFORE THE PROCEDURE  Do not take aspirin or blood thinners (anticoagulants) for 1 week before the procedure, or as told by your caregiver.  Eat a light meal before the procedure.  Ask your caregiver about changing or stopping your regular medicines.  You may be given a pain reliever 1 or 2 hours before the procedure. PROCEDURE   A tool (speculum) is placed in the vagina. This allows your caregiver to see the cervix.  An iodine stain is applied to the cervix to find the area of abnormal cells to be removed.  Medicine is injected to numb the cervix (local  anesthetic).   Electricity is passed through a thin wire loop which is then used to remove (cauterize) a small segment of the affected cervix.  Light electrocautery is used to seal any small blood vessels and prevent bleeding.  A paste may be applied to the cauterized area of the cervix to help prevent bleeding.  The tissue sample is sent to the lab. It is examined under the microscope. AFTER THE PROCEDURE  Have someone drive you home.  You may have slight to moderate cramping.  You may notice a black vaginal discharge from the paste used on the cervix to prevent bleeding. This is normal.  Watch for excessive bleeding. This requires immediate medical care.  Ask when your test results will be ready. Make sure you get your test results. Document Released: 07/10/2002 Document Revised: 07/12/2011 Document Reviewed: 09/29/2010 ExitCare Patient Information 2013 [employer].  

## 2012-04-27 NOTE — Progress Notes (Signed)
Patient ID: Michele [name], female   DOB: 07-19-1975, 36 y.o.   MRN: [id number] Patient's last menstrual period was 04/13/2012. 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 Offered LEEP and she viewed video education. The procedure and risks were explained. She will schedule procedure in approximately 2 weeks with me. Patient requests Mirena for contraception.   [name] 04/27/2012 1:56 PM

## 2012-05-02 ENCOUNTER — Other Ambulatory Visit: Payer: Self-pay | Admitting: Otolaryngology

## 2012-05-02 DIAGNOSIS — D490 Neoplasm of unspecified behavior of digestive system: Secondary | ICD-10-CM

## 2012-05-08 ENCOUNTER — Ambulatory Visit
Admission: RE | Admit: 2012-05-08 | Discharge: 2012-05-08 | Disposition: A | Discharge status: Home / Self Care | Payer: Medicaid Other | Source: Ambulatory Visit | Attending: Otolaryngology | Admitting: Otolaryngology

## 2012-05-08 DIAGNOSIS — D490 Neoplasm of unspecified behavior of digestive system: Secondary | ICD-10-CM

## 2012-05-08 MED ORDER — IOHEXOL 300 MG/ML  SOLN
75.0000 mL | Freq: Once | INTRAMUSCULAR | Status: AC | PRN
  Administered 2012-05-08: 75 mL via INTRAVENOUS

## 2012-05-11 ENCOUNTER — Ambulatory Visit (INDEPENDENT_AMBULATORY_CARE_PROVIDER_SITE_OTHER): Payer: Medicaid Other | Admitting: Obstetrics & Gynecology

## 2012-05-11 ENCOUNTER — Encounter: Payer: Self-pay | Admitting: Obstetrics & Gynecology

## 2012-05-11 ENCOUNTER — Other Ambulatory Visit (HOSPITAL_COMMUNITY)
Admission: RE | Admit: 2012-05-11 | Discharge: 2012-05-11 | Disposition: A | Discharge status: Home / Self Care | Payer: Medicaid Other | Source: Ambulatory Visit | Attending: Obstetrics & Gynecology | Admitting: Obstetrics & Gynecology

## 2012-05-11 VITALS — BP 121/77 | HR 60 | Temp 99.2°F | Ht 61.5 in | Wt 157.6 lb

## 2012-05-11 DIAGNOSIS — N871 Moderate cervical dysplasia: Secondary | ICD-10-CM | POA: Insufficient documentation

## 2012-05-11 DIAGNOSIS — Z01812 Encounter for preprocedural laboratory examination: Secondary | ICD-10-CM

## 2012-05-11 LAB — POCT PREGNANCY, URINE: Preg Test, Ur: NEGATIVE

## 2012-05-11 NOTE — Patient Instructions (Signed)
Loop Electrosurgical Excision Procedure  Care After  Refer to this sheet in the next few weeks. These instructions provide you with information on caring for yourself after your procedure. Your caregiver may also give you more specific instructions. Your treatment has been planned according to current medical practices, but problems sometimes occur. Call your caregiver if you have any problems or questions after your procedure.  HOME CARE INSTRUCTIONS   · Do not use tampons, douche, or have sexual intercourse for 2 weeks or as directed by your caregiver.  · Begin normal activities if you have no or minimal cramping or bleeding, unless directed otherwise by your caregiver.  · Take your temperature if you feel sick. Write down your temperature on paper, and tell your caregiver if you have a fever.  · Take all medicines as directed by your caregiver.  · Keep all your follow-up appointments and Pap tests as directed by your caregiver.  SEEK IMMEDIATE MEDICAL CARE IF:   · You have bleeding that is heavier or longer than a normal menstrual cycle.  · You have bleeding that is bright red.  · You have blood clots.  · You have a fever.  · You have increasing cramps or pain not relieved by medicine.  · You develop abdominal pain that does not seem to be related to the same area of earlier cramping and pain.  · You are lightheaded, unusually weak, or faint.  · You develop painful or bloody urination.  · You develop a bad smelling vaginal discharge.  MAKE SURE YOU:  · Understand these instructions.  · Will watch your condition.  · Will get help right away if you are not doing well or get worse.  Document Released: 12/31/2010 Document Revised: 07/12/2011 Document Reviewed: 12/31/2010  ExitCare® Patient Information ©2013 [employer].

## 2012-05-11 NOTE — Progress Notes (Signed)
Patient ID: ANAIH BRANDER, female   DOB: [birth year], 37 y.o.   MRN: [id number] Patient's last menstrual period was 04/15/2012. Scheduled for LEEP.  Patient identified, informed consent obtained, signed copy in chart, time out performed.  Pap smear and colposcopy reviewed.   Pap ASCUS  Colpo Biopsy CIN 1-2 ECC negative Teflon coated speculum with smoke evacuator placed.  Cervix visualized. Paracervical block placed.  medium size Fisher loop used to remove cone of cervix using blend of cut and cautery on LEEP machine.  Edges/Base cauterized with Ball.  Monsel's solution used for hemostasis.  Patient tolerated procedure well.  Patient given post procedure instructions.  Follow up in 6 months for repeat pap or as needed.   Reshanda Lewey 05/11/2012 3:59 PM

## 2012-05-15 ENCOUNTER — Telehealth: Payer: Self-pay | Admitting: *Deleted

## 2012-05-15 NOTE — Telephone Encounter (Signed)
Patient left a message to find out when she needs to come back in to be seen and also to see if Dr. Debroah Loop was going to prescribe her birthcontrol pills. In Dr. Brayton Layman note he states that patient should return in 6 months for repeat PAP and there is no mention of birth control.

## 2012-05-15 NOTE — Telephone Encounter (Signed)
Called pt and discussed her concern. She states that the paper she received after the LEEP procedure says she does not need to come back for 6 months. I stated that is correct. She will receive a phone call if her LEEP results are abnormal and will need a repeat Pap in 6 months. Pt then stated that she is wanting to have an IUD for birth control. Her LMP was 04/15/12 and she had unprotected sex once after that. I advised pt to schedule clinic appt for IUD insertion at the end of this month. She should abstain completely from intercourse until that appt. If she has had a period by the time of the appt, she may keep the appt and if her UPT is negative, the IUD can be inserted. If she does not get a period before her IUD appt, she will need to reschedule. Pt voiced understanding.

## 2012-05-16 ENCOUNTER — Encounter: Payer: Self-pay | Admitting: *Deleted

## 2012-05-17 ENCOUNTER — Other Ambulatory Visit: Payer: Self-pay | Admitting: Otolaryngology

## 2012-05-17 ENCOUNTER — Other Ambulatory Visit (HOSPITAL_COMMUNITY)
Admission: RE | Admit: 2012-05-17 | Discharge: 2012-05-17 | Disposition: A | Discharge status: Home / Self Care | Payer: Medicaid Other | Source: Ambulatory Visit | Attending: Otolaryngology | Admitting: Otolaryngology

## 2012-05-17 DIAGNOSIS — K119 Disease of salivary gland, unspecified: Secondary | ICD-10-CM | POA: Insufficient documentation

## 2012-06-05 ENCOUNTER — Ambulatory Visit (INDEPENDENT_AMBULATORY_CARE_PROVIDER_SITE_OTHER): Payer: Medicaid Other | Admitting: Obstetrics & Gynecology

## 2012-06-05 VITALS — BP 126/83 | HR 63 | Ht 61.5 in | Wt 158.4 lb

## 2012-06-05 DIAGNOSIS — Z30430 Encounter for insertion of intrauterine contraceptive device: Secondary | ICD-10-CM

## 2012-06-05 DIAGNOSIS — Z01812 Encounter for preprocedural laboratory examination: Secondary | ICD-10-CM

## 2012-06-05 LAB — POCT PREGNANCY, URINE: Preg Test, Ur: NEGATIVE

## 2012-06-05 MED ORDER — PARAGARD INTRAUTERINE COPPER IU IUD
1.0000 | INTRAUTERINE_SYSTEM | Freq: Once | INTRAUTERINE | Status: AC
  Administered 2012-06-05: 1 via INTRAUTERINE

## 2012-06-05 NOTE — Progress Notes (Signed)
Patient ID: Michele [name], female   DOB: [birth year], 37 y.o.   MRN: [id number] Patient identified, informed consent performed, signed copy in chart, time out was performed.  Urine pregnancy test negative.  Speculum placed in the vagina.  Cervix visualized.  Cleaned with Betadine x 2.  Grasped anteriourly with a single tooth tenaculum.  Uterus sounded to 9cm.  Paragard IUD placed per manufacturer's recommendations.  Strings trimmed to 3 cm.   Patient given post procedure instructions and Paragard care card with expiration date.  Patient is asked to check IUD strings periodically and follow up in 4-6 weeks for IUD check.  Reviewed result of LEEP 05/11/12, CIN 2, clear margins. Repeat pap with cotesting in 12 months   [name] 06/05/2012 3:50 PM

## 2012-06-05 NOTE — Patient Instructions (Signed)
Intrauterine Device Insertion Care After Refer to this sheet in the next few weeks. These instructions provide you with information on caring for yourself after your procedure. Your caregiver may also give you more specific instructions. Your treatment has been planned according to current medical practices, but problems sometimes occur. Call your caregiver if you have any problems or questions after your procedure. HOME CARE INSTRUCTIONS   Only take over-the-counter or prescription medicines for pain, discomfort, or fever as directed by your caregiver. Do not use aspirin. This may increase bleeding.  Check your IUD to make sure it is in place before you resume sexual activity. You should be able to feel the strings. If you cannot feel the strings, something may be wrong. The IUD may have fallen out of the uterus, or the uterus may have been punctured (perforated) during placement. Also, if the strings are getting longer, it may mean that the IUD is being forced out of the uterus. You no longer have full protection from pregnancy if any of these problems occur.  You may resume sexual intercourse if you are not having problems with the IUD. The IUD is considered immediately effective.  You may resume normal activities.  Keep all follow-up appointments to be sure your IUD has remained in place. After the first exam, yearly exams are advised, unless you cannot feel the strings of your IUD.  Continue to check that the IUD is still in place by feeling for the strings after every menstrual period. SEEK MEDICAL CARE IF:   You have bleeding that is heavier or lasts longer than a normal menstrual cycle.  You have a fever.  You have increasing cramps or abdominal pain not relieved with medicine.  You have abdominal pain that does not seem to be related to the same area of earlier cramping and pain.  You are lightheaded, unusually weak, or faint.  You have abnormal vaginal discharge or  smells.  You have pain during sexual intercourse.  You cannot feel the IUD strings, or the IUD string has gotten longer.  You feel the IUD at the opening of the cervix in the vagina.  You think you are pregnant, or you miss your menstrual period.  The IUD string is hurting your sex partner. Document Released: 12/16/2010 Document Revised: 07/12/2011 Document Reviewed: 12/16/2010 ExitCare Patient Information 2013 [employer].  

## 2012-06-07 ENCOUNTER — Other Ambulatory Visit (HOSPITAL_COMMUNITY): Payer: Self-pay | Admitting: Otolaryngology

## 2012-06-07 DIAGNOSIS — K118 Other diseases of salivary glands: Secondary | ICD-10-CM

## 2012-06-09 ENCOUNTER — Other Ambulatory Visit: Payer: Self-pay | Admitting: Radiology

## 2012-06-12 ENCOUNTER — Encounter (HOSPITAL_COMMUNITY): Payer: Self-pay | Admitting: Pharmacy Technician

## 2012-06-13 ENCOUNTER — Ambulatory Visit (HOSPITAL_COMMUNITY)
Admission: RE | Admit: 2012-06-13 | Discharge: 2012-06-13 | Disposition: A | Discharge status: Home / Self Care | Payer: Medicaid Other | Source: Ambulatory Visit | Attending: Otolaryngology | Admitting: Otolaryngology

## 2012-06-13 ENCOUNTER — Ambulatory Visit (HOSPITAL_COMMUNITY): Payer: Medicaid Other

## 2012-06-13 DIAGNOSIS — D119 Benign neoplasm of major salivary gland, unspecified: Secondary | ICD-10-CM | POA: Insufficient documentation

## 2012-06-13 DIAGNOSIS — K118 Other diseases of salivary glands: Secondary | ICD-10-CM

## 2012-06-13 NOTE — H&P (Signed)
Chief Complaint: "I'm here for a biopsy" Referring Physician:Byers HPI: Michele [name] is an 37 y.o. 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 She is referred for US guided biopsy to obtain definitive tissue diagnosis. PMHx and meds reviewed.  Past Medical History:  Past Medical History  Diagnosis Date  . Ectopic pregnancy, tubal 2010    Past Surgical History:  Past Surgical History  Procedure Laterality Date  . Ectopic pregnancy surgery  2010  . Cesarean section      Family History:  Family History  Problem Relation Age of Onset  . Anesthesia problems Neg Hx   . Kidney disease Mother   . Cancer Sister   . Cancer Maternal Aunt     Social History:  reports that she has never smoked. She has never used smokeless tobacco. She reports that she does not drink alcohol or use illicit drugs.  Allergies: No Known Allergies  Medications: None  Please HPI for pertinent positives, otherwise complete 10 system ROS negative.  Physical Exam: LMP 05/16/2012 No Vitals taken   General Appearance:  Alert, cooperative, no distress, appears stated age  Head:  Normocephalic, without obvious abnormality, atraumatic  ENT: Unremarkable  Neck: Supple, symmetrical, trachea midline. Palpable NT mass right parotid.submandibular area.  Lungs:   Clear to auscultation bilaterally, no w/r/r, respirations unlabored without use of accessory muscles.  Heart:  Regular rate and rhythm, S1, S2 normal, no murmur, rub or gallop.    No results found for this or any previous visit (from the past 48 hour(s)). No results found.  Assessment/Plan Rt parotid mass Discussed US guided biopsy of this mass, including risks, complications. Consent signed in chart  Brayton El PA-C 06/13/2012, 1:22 PM

## 2012-06-13 NOTE — Procedures (Signed)
Successful [place] RT PAROTID MASS FNA X 2 NO COMP STABLE FULL REPORT IN PACS PATH PENDING

## 2012-07-17 ENCOUNTER — Ambulatory Visit: Payer: Medicaid Other | Admitting: Obstetrics & Gynecology

## 2012-08-07 ENCOUNTER — Ambulatory Visit: Payer: Medicaid Other | Admitting: Obstetrics & Gynecology

## 2012-08-16 ENCOUNTER — Other Ambulatory Visit: Payer: Self-pay | Admitting: Otolaryngology

## 2014-03-04 ENCOUNTER — Encounter: Payer: Self-pay | Admitting: Obstetrics & Gynecology

## 2015-05-27 ENCOUNTER — Other Ambulatory Visit: Payer: Self-pay | Admitting: Otolaryngology

## 2016-02-11 ENCOUNTER — Ambulatory Visit: Payer: Medicaid Other | Admitting: Obstetrics & Gynecology

## 2016-03-01 ENCOUNTER — Encounter: Payer: Self-pay | Admitting: Obstetrics & Gynecology

## 2016-03-01 ENCOUNTER — Ambulatory Visit (INDEPENDENT_AMBULATORY_CARE_PROVIDER_SITE_OTHER): Payer: Medicaid Other | Admitting: Obstetrics & Gynecology

## 2016-03-01 ENCOUNTER — Other Ambulatory Visit (HOSPITAL_COMMUNITY)
Admission: RE | Admit: 2016-03-01 | Discharge: 2016-03-01 | Disposition: A | Discharge status: Home / Self Care | Payer: Medicaid Other | Source: Ambulatory Visit | Attending: Obstetrics & Gynecology | Admitting: Obstetrics & Gynecology

## 2016-03-01 VITALS — BP 147/75 | HR 84 | Ht 61.5 in | Wt 155.0 lb

## 2016-03-01 DIAGNOSIS — Z1151 Encounter for screening for human papillomavirus (HPV): Secondary | ICD-10-CM | POA: Insufficient documentation

## 2016-03-01 DIAGNOSIS — Z01411 Encounter for gynecological examination (general) (routine) with abnormal findings: Secondary | ICD-10-CM | POA: Diagnosis present

## 2016-03-01 DIAGNOSIS — N871 Moderate cervical dysplasia: Secondary | ICD-10-CM | POA: Diagnosis present

## 2016-03-01 DIAGNOSIS — Z124 Encounter for screening for malignant neoplasm of cervix: Secondary | ICD-10-CM | POA: Diagnosis not present

## 2016-03-01 NOTE — Progress Notes (Signed)
Patient ID: Michele [name], female   DOB: 1976-01-31, 40 y.o.   MRN: [id number]  F/u abnl pap at [facility]  HPI Michele [name] is a 40 y.o. female.  [id number] Patient's last menstrual period was 02/09/2016 (approximate).  HPI  Indications: Pap smear on July 2017 showed: unknown result, referral not found.. Previous colposcopy: CIN 2 and in 2014. Prior cervical treatment: LLETZ.  Past Medical History:  Diagnosis Date  . Ectopic pregnancy, tubal 2010    Past Surgical History:  Procedure Laterality Date  . CESAREAN SECTION    . ECTOPIC PREGNANCY SURGERY  2010    Family History  Problem Relation Age of Onset  . Kidney disease Mother   . Cancer Sister   . Cancer Maternal Aunt   . Anesthesia problems Neg Hx     Social History Social History  Substance Use Topics  . Smoking status: Never Smoker  . Smokeless tobacco: Never Used  . Alcohol use No    No Known Allergies  No current outpatient prescriptions on file.   No current facility-administered medications for this visit.     Review of Systems Review of Systems  Genitourinary: Negative for menstrual problem, vaginal bleeding and vaginal discharge.    Blood pressure (!) 147/75, pulse 84, height 5' 1.5" (1.562 m), weight 155 lb (70.3 kg), last menstrual period 02/09/2016.  Physical Exam Physical Exam  Constitutional: She appears well-developed. No distress.  Genitourinary: Vagina normal.  Psychiatric: She has a normal mood and affect. Her behavior is normal.    Data Reviewed Path reports  Assessment    Procedure Details    Speculum placed in vagina and excellent visualization of cervix achieved,  Specimens: pap  And cotesting done as no result on previous pap is available  Complications: none.     Plan    Specimens labelled and sent to Pathology. If abnormal colposcopy if indicated      Michele [name] 03/01/2016, 2:29 PM

## 2016-03-02 LAB — CYTOLOGY - PAP
Diagnosis: UNDETERMINED — AB
HPV: DETECTED — AB

## 2016-04-05 ENCOUNTER — Telehealth: Payer: Self-pay | Admitting: *Deleted

## 2016-04-05 NOTE — Telephone Encounter (Signed)
I called Michele [name] and had a poor connection- she could not hear me.

## 2016-04-05 NOTE — Telephone Encounter (Signed)
Per message from Dr. Roselie Awkward , patient needs colposcopy scheduled and to be notified of results and appt.

## 2016-04-12 NOTE — Telephone Encounter (Signed)
I called Michele [name] back and we discussed her pap was abnormal - she needs a colposcopy- she has had one before. I informed her registrars will call her with an appointment. She voices understanding.

## 2016-04-23 ENCOUNTER — Encounter: Payer: Self-pay | Admitting: Obstetrics & Gynecology

## 2016-05-27 ENCOUNTER — Ambulatory Visit (INDEPENDENT_AMBULATORY_CARE_PROVIDER_SITE_OTHER): Payer: Medicaid Other | Admitting: Obstetrics & Gynecology

## 2016-05-27 ENCOUNTER — Other Ambulatory Visit (HOSPITAL_COMMUNITY)
Admission: RE | Admit: 2016-05-27 | Discharge: 2016-05-27 | Disposition: A | Discharge status: Home / Self Care | Payer: Medicaid Other | Source: Ambulatory Visit | Attending: Obstetrics & Gynecology | Admitting: Obstetrics & Gynecology

## 2016-05-27 ENCOUNTER — Encounter: Payer: Self-pay | Admitting: Obstetrics & Gynecology

## 2016-05-27 VITALS — BP 125/77 | HR 75 | Wt 156.2 lb

## 2016-05-27 DIAGNOSIS — R87810 Cervical high risk human papillomavirus (HPV) DNA test positive: Secondary | ICD-10-CM

## 2016-05-27 DIAGNOSIS — Z3202 Encounter for pregnancy test, result negative: Secondary | ICD-10-CM

## 2016-05-27 DIAGNOSIS — R87610 Atypical squamous cells of undetermined significance on cytologic smear of cervix (ASC-US): Secondary | ICD-10-CM | POA: Diagnosis present

## 2016-05-27 LAB — POCT PREGNANCY, URINE: Preg Test, Ur: NEGATIVE

## 2016-05-27 NOTE — Progress Notes (Signed)
Patient ID: Michele [name], female   DOB: 1975/11/11, 41 y.o.   MRN: [id number]  Chief Complaint  Patient presents with  . Colposcopy    HPI Michele [name] is a 41 y.o. female.  Patient's last menstrual period was 05/06/2016 (approximate).  HPI  Indications: Pap smear on October 2017 showed: ASCUS with POSITIVE high risk HPV. Previous colposcopy: CIN 2 and in 2014. Prior cervical treatment: LLETZ.  Past Medical History:  Diagnosis Date  . Ectopic pregnancy, tubal 2010    Past Surgical History:  Procedure Laterality Date  . CESAREAN SECTION    . ECTOPIC PREGNANCY SURGERY  2010    Family History  Problem Relation Age of Onset  . Kidney disease Mother   . Cancer Sister   . Cancer Maternal Aunt   . Anesthesia problems Neg Hx     Social History Social History  Substance Use Topics  . Smoking status: Never Smoker  . Smokeless tobacco: Never Used  . Alcohol use No    No Known Allergies  No current outpatient prescriptions on file.   No current facility-administered medications for this visit.     Review of Systems Review of Systems  Blood pressure 125/77, pulse 75, weight 156 lb 3.2 oz (70.9 kg), last menstrual period 05/06/2016.  Physical Exam Physical Exam  Data Reviewed Pap and pathology results  Assessment    Procedure Details  The risks and benefits of the procedure and Written informed consent obtained.  Speculum placed in vagina and excellent visualization of cervix achieved, cervix swabbed x 3 with acetic acid solution. SCJ and TZ seen, small anterior AWE lesion no endocervical lesion seen Specimens: ECC and Bx at [id number]  Complications: none.     Plan    Specimens labelled and sent to Pathology. Call to discuss Pathology results in 2 weeks      Emeterio Reeve 05/27/2016, 11:23 AM

## 2016-06-07 ENCOUNTER — Telehealth: Payer: Self-pay

## 2016-06-07 NOTE — Telephone Encounter (Signed)
Benign result, repeat pap and cotest in 1 year   Left message for patient to call us back regarding test results.

## 2016-06-09 NOTE — Telephone Encounter (Signed)
Called patient who was already aware of result as they are on her MyChart. Advised her to f/u with a pap test in one year. Patient voiced understanding.

## 2017-04-15 ENCOUNTER — Other Ambulatory Visit: Payer: Self-pay

## 2017-04-15 ENCOUNTER — Encounter (HOSPITAL_COMMUNITY): Payer: Self-pay | Admitting: Emergency Medicine

## 2017-04-15 ENCOUNTER — Emergency Department (HOSPITAL_COMMUNITY)
Admission: EM | Admit: 2017-04-15 | Discharge: 2017-04-15 | Disposition: A | Discharge status: Home / Self Care | Payer: Medicaid Other | Attending: Emergency Medicine | Admitting: Emergency Medicine

## 2017-04-15 DIAGNOSIS — Y92410 Unspecified street and highway as the place of occurrence of the external cause: Secondary | ICD-10-CM | POA: Diagnosis not present

## 2017-04-15 DIAGNOSIS — Y9389 Activity, other specified: Secondary | ICD-10-CM | POA: Diagnosis not present

## 2017-04-15 DIAGNOSIS — M7918 Myalgia, other site: Secondary | ICD-10-CM | POA: Diagnosis not present

## 2017-04-15 DIAGNOSIS — Y999 Unspecified external cause status: Secondary | ICD-10-CM | POA: Diagnosis not present

## 2017-04-15 DIAGNOSIS — T148XXA Other injury of unspecified body region, initial encounter: Secondary | ICD-10-CM | POA: Diagnosis present

## 2017-04-15 MED ORDER — IBUPROFEN 400 MG PO TABS
600.0000 mg | ORAL_TABLET | Freq: Once | ORAL | Status: AC
  Administered 2017-04-15: 600 mg via ORAL
  Filled 2017-04-15: qty 1

## 2017-04-15 MED ORDER — IBUPROFEN 600 MG PO TABS: 600.0000 mg | ORAL_TABLET | Freq: Four times a day (QID) | ORAL | 0 refills | Status: DC | PRN

## 2017-04-15 MED ORDER — CYCLOBENZAPRINE HCL 10 MG PO TABS: 10.0000 mg | ORAL_TABLET | Freq: Two times a day (BID) | ORAL | 0 refills | Status: AC | PRN
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
## 2017-04-17 ENCOUNTER — Other Ambulatory Visit: Payer: Self-pay

## 2017-04-17 ENCOUNTER — Encounter (HOSPITAL_COMMUNITY): Payer: Self-pay | Admitting: *Deleted

## 2017-04-17 ENCOUNTER — Emergency Department (HOSPITAL_COMMUNITY)
Admission: EM | Admit: 2017-04-17 | Discharge: 2017-04-17 | Disposition: A | Discharge status: Home / Self Care | Payer: Medicaid Other | Attending: Emergency Medicine | Admitting: Emergency Medicine

## 2017-04-17 DIAGNOSIS — Z041 Encounter for examination and observation following transport accident: Secondary | ICD-10-CM | POA: Insufficient documentation

## 2017-04-17 DIAGNOSIS — M25511 Pain in right shoulder: Secondary | ICD-10-CM | POA: Diagnosis present

## 2017-04-17 MED ORDER — DICLOFENAC SODIUM 1 % TD GEL: 2.0000 g | Freq: Four times a day (QID) | TRANSDERMAL | 0 refills | Status: AC

## 2017-04-17 MED ORDER — METHOCARBAMOL 500 MG PO TABS
500.0000 mg | ORAL_TABLET | Freq: Once | ORAL | Status: AC
Start: 2017-04-17 — End: 2017-04-17
  Administered 2017-04-17: 500 mg via ORAL
  Filled 2017-04-17: qty 1

## 2017-04-17 MED ORDER — NAPROXEN 500 MG PO TABS: 500.0000 mg | ORAL_TABLET | Freq: Two times a day (BID) | ORAL | 0 refills | Status: DC

## 2017-04-17 MED ORDER — NAPROXEN 250 MG PO TABS
500.0000 mg | ORAL_TABLET | Freq: Once | ORAL | Status: AC
Start: 2017-04-17 — End: 2017-04-17
  Administered 2017-04-17: 500 mg via ORAL
  Filled 2017-04-17: qty 2
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
## 2017-04-17 NOTE — Progress Notes (Signed)
Orthopedic Tech Progress Note Patient Details:  Michele [name] 01-Nov-1975 [id number]  Ortho Devices Type of Ortho Device: Arm sling Ortho Device/Splint Location: rue Ortho Device/Splint Interventions: Ordered, Adjustment   Post Interventions Patient Tolerated: Well Instructions Provided: Care of device, Adjustment of device   [name] 04/17/2017, 11:25 PM
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
## 2017-04-17 NOTE — ED Notes (Signed)
PAGED ORTHO FOR SLING TO BE PLACED

## 2021-01-28 ENCOUNTER — Other Ambulatory Visit: Payer: Self-pay | Admitting: Obstetrics & Gynecology

## 2021-03-04 ENCOUNTER — Other Ambulatory Visit: Payer: Self-pay | Admitting: Obstetrics & Gynecology

## 2021-03-23 ENCOUNTER — Encounter (HOSPITAL_BASED_OUTPATIENT_CLINIC_OR_DEPARTMENT_OTHER): Payer: Self-pay | Admitting: Obstetrics & Gynecology

## 2021-03-23 ENCOUNTER — Other Ambulatory Visit: Payer: Self-pay

## 2021-03-23 NOTE — Progress Notes (Signed)
Spoke w/ via phone for pre-op interview--- Michele [name] needs dos----   UPT, CBC, T&S            [name] results------ COVID test -----patient states asymptomatic no test needed Arrive at ------- 1015 NPO after MN NO Solid Food.  Clear liquids from MN until---0915  Med rec completed Medications to take morning of surgery -----NONE Diabetic medication ----- Patient instructed no nail polish to be worn day of surgery Patient instructed to bring photo id and insurance card day of surgery Patient aware to have Driver (ride ) / caregiver    for 24 hours after surgery  Patient Special Instructions ----- Pre-Op special Istructions ----- Patient verbalized understanding of instructions that were given at this phone interview. Patient denies shortness of breath, chest pain, fever, cough at this phone interview.

## 2021-03-31 ENCOUNTER — Other Ambulatory Visit: Payer: Self-pay | Admitting: Endocrinology

## 2021-03-31 DIAGNOSIS — Z1231 Encounter for screening mammogram for malignant neoplasm of breast: Secondary | ICD-10-CM

## 2021-04-01 ENCOUNTER — Encounter (HOSPITAL_BASED_OUTPATIENT_CLINIC_OR_DEPARTMENT_OTHER)
Admission: RE | Disposition: A | Discharge status: Home / Self Care | Payer: Self-pay | Source: Home / Self Care | Attending: Obstetrics & Gynecology

## 2021-04-01 ENCOUNTER — Other Ambulatory Visit: Payer: Self-pay

## 2021-04-01 ENCOUNTER — Ambulatory Visit (HOSPITAL_BASED_OUTPATIENT_CLINIC_OR_DEPARTMENT_OTHER)
Admission: RE | Admit: 2021-04-01 | Discharge: 2021-04-01 | Disposition: A | Discharge status: Home / Self Care | Payer: Medicaid Other | Attending: Obstetrics & Gynecology | Admitting: Obstetrics & Gynecology

## 2021-04-01 ENCOUNTER — Ambulatory Visit (HOSPITAL_BASED_OUTPATIENT_CLINIC_OR_DEPARTMENT_OTHER): Payer: Medicaid Other | Admitting: Certified Registered"

## 2021-04-01 ENCOUNTER — Encounter (HOSPITAL_BASED_OUTPATIENT_CLINIC_OR_DEPARTMENT_OTHER): Payer: Self-pay | Admitting: Obstetrics & Gynecology

## 2021-04-01 DIAGNOSIS — N871 Moderate cervical dysplasia: Secondary | ICD-10-CM | POA: Insufficient documentation

## 2021-04-01 HISTORY — PX: CERVICAL CONIZATION W/BX: SHX1330

## 2021-04-01 HISTORY — DX: Other specified postprocedural states: Z98.890

## 2021-04-01 HISTORY — DX: Prediabetes: R73.03

## 2021-04-01 HISTORY — DX: Nausea with vomiting, unspecified: R11.2

## 2021-04-01 LAB — CBC
HCT: 47.6 % — ABNORMAL HIGH (ref 36.0–46.0)
Hemoglobin: 16 g/dL — ABNORMAL HIGH (ref 12.0–15.0)
MCH: 31 pg (ref 26.0–34.0)
MCHC: 33.6 g/dL (ref 30.0–36.0)
MCV: 92.2 fL (ref 80.0–100.0)
Platelets: 293 10*3/uL (ref 150–400)
RBC: 5.16 MIL/uL — ABNORMAL HIGH (ref 3.87–5.11)
RDW: 13.2 % (ref 11.5–15.5)
WBC: 3.9 10*3/uL — ABNORMAL LOW (ref 4.0–10.5)
nRBC: 0 % (ref 0.0–0.2)

## 2021-04-01 LAB — POCT PREGNANCY, URINE: Preg Test, Ur: NEGATIVE

## 2021-04-01 LAB — TYPE AND SCREEN
ABO/RH(D): A POS
Antibody Screen: NEGATIVE

## 2021-04-01 SURGERY — CONE BIOPSY, CERVIX
Anesthesia: Monitor Anesthesia Care | Site: Vagina

## 2021-04-01 MED ORDER — PROPOFOL 500 MG/50ML IV EMUL
INTRAVENOUS | Status: AC
  Filled 2021-04-01: qty 50

## 2021-04-01 MED ORDER — PROPOFOL 10 MG/ML IV BOLUS
INTRAVENOUS | Status: DC | PRN
  Administered 2021-04-01: 40 mg via INTRAVENOUS

## 2021-04-01 MED ORDER — MIDAZOLAM HCL 2 MG/2ML IJ SOLN
INTRAMUSCULAR | Status: AC
  Filled 2021-04-01: qty 2

## 2021-04-01 MED ORDER — CEFAZOLIN SODIUM-DEXTROSE 2-4 GM/100ML-% IV SOLN
INTRAVENOUS | Status: AC
  Filled 2021-04-01: qty 100

## 2021-04-01 MED ORDER — DEXAMETHASONE SODIUM PHOSPHATE 10 MG/ML IJ SOLN
INTRAMUSCULAR | Status: AC
  Filled 2021-04-01: qty 1

## 2021-04-01 MED ORDER — FERRIC SUBSULFATE SOLN
Status: DC | PRN
  Administered 2021-04-01: 1

## 2021-04-01 MED ORDER — OXYCODONE HCL 5 MG/5ML PO SOLN: 5.0000 mg | Freq: Once | ORAL | Status: DC | PRN

## 2021-04-01 MED ORDER — KETOROLAC TROMETHAMINE 30 MG/ML IJ SOLN
INTRAMUSCULAR | Status: DC | PRN
  Administered 2021-04-01: 30 mg via INTRAVENOUS

## 2021-04-01 MED ORDER — ONDANSETRON HCL 4 MG/2ML IJ SOLN
INTRAMUSCULAR | Status: DC | PRN
  Administered 2021-04-01: 4 mg via INTRAVENOUS

## 2021-04-01 MED ORDER — IODINE STRONG (LUGOLS) 5 % PO SOLN
ORAL | Status: DC | PRN
  Administered 2021-04-01: 5 mL

## 2021-04-01 MED ORDER — IBUPROFEN 800 MG PO TABS: 800.0000 mg | ORAL_TABLET | Freq: Three times a day (TID) | ORAL | 3 refills | Status: AC | PRN

## 2021-04-01 MED ORDER — AMISULPRIDE (ANTIEMETIC) 5 MG/2ML IV SOLN: 10.0000 mg | Freq: Once | INTRAVENOUS | Status: DC | PRN

## 2021-04-01 MED ORDER — OXYCODONE HCL 5 MG PO TABS: 5.0000 mg | ORAL_TABLET | Freq: Once | ORAL | Status: DC | PRN

## 2021-04-01 MED ORDER — CEFAZOLIN SODIUM-DEXTROSE 2-4 GM/100ML-% IV SOLN
2.0000 g | INTRAVENOUS | Status: AC
  Administered 2021-04-01: 2 g via INTRAVENOUS

## 2021-04-01 MED ORDER — POVIDONE-IODINE 10 % EX SWAB: 2.0000 | Freq: Once | CUTANEOUS | Status: DC

## 2021-04-01 MED ORDER — PROPOFOL 500 MG/50ML IV EMUL
INTRAVENOUS | Status: DC | PRN
  Administered 2021-04-01: 200 ug/kg/min via INTRAVENOUS

## 2021-04-01 MED ORDER — FENTANYL CITRATE (PF) 100 MCG/2ML IJ SOLN
INTRAMUSCULAR | Status: DC | PRN
  Administered 2021-04-01 (×4): 25 ug via INTRAVENOUS

## 2021-04-01 MED ORDER — FENTANYL CITRATE (PF) 100 MCG/2ML IJ SOLN: 25.0000 ug | INTRAMUSCULAR | Status: DC | PRN

## 2021-04-01 MED ORDER — SCOPOLAMINE 1 MG/3DAYS TD PT72
MEDICATED_PATCH | TRANSDERMAL | Status: AC
  Filled 2021-04-01: qty 1

## 2021-04-01 MED ORDER — LIDOCAINE HCL 1 % IJ SOLN
INTRAMUSCULAR | Status: DC | PRN
  Administered 2021-04-01: 20 mL

## 2021-04-01 MED ORDER — LACTATED RINGERS IV SOLN: INTRAVENOUS | Status: DC

## 2021-04-01 MED ORDER — SODIUM CHLORIDE 0.9% FLUSH: 3.0000 mL | Freq: Two times a day (BID) | INTRAVENOUS | Status: DC

## 2021-04-01 MED ORDER — DEXAMETHASONE SODIUM PHOSPHATE 4 MG/ML IJ SOLN
INTRAMUSCULAR | Status: DC | PRN
  Administered 2021-04-01: 4 mg via INTRAVENOUS

## 2021-04-01 MED ORDER — SCOPOLAMINE 1 MG/3DAYS TD PT72
1.0000 | MEDICATED_PATCH | TRANSDERMAL | Status: DC
  Administered 2021-04-01: 1.5 mg via TRANSDERMAL

## 2021-04-01 MED ORDER — ACETAMINOPHEN 500 MG PO TABS
ORAL_TABLET | ORAL | Status: AC
  Filled 2021-04-01: qty 2

## 2021-04-01 MED ORDER — KETOROLAC TROMETHAMINE 30 MG/ML IJ SOLN
INTRAMUSCULAR | Status: AC
  Filled 2021-04-01: qty 1

## 2021-04-01 MED ORDER — PROMETHAZINE HCL 25 MG/ML IJ SOLN: 6.2500 mg | INTRAMUSCULAR | Status: DC | PRN

## 2021-04-01 MED ORDER — KETOROLAC TROMETHAMINE 30 MG/ML IJ SOLN: 30.0000 mg | Freq: Once | INTRAMUSCULAR | Status: DC | PRN

## 2021-04-01 MED ORDER — LIDOCAINE-EPINEPHRINE 0.5 %-1:200000 IJ SOLN
INTRAMUSCULAR | Status: DC | PRN
  Administered 2021-04-01: 1 mL

## 2021-04-01 MED ORDER — 0.9 % SODIUM CHLORIDE (POUR BTL) OPTIME
TOPICAL | Status: DC | PRN
  Administered 2021-04-01: 500 mL

## 2021-04-01 MED ORDER — HYDROCODONE-ACETAMINOPHEN 5-325 MG PO TABS: 1.0000 | ORAL_TABLET | ORAL | 0 refills | Status: AC | PRN

## 2021-04-01 MED ORDER — ACETAMINOPHEN 500 MG PO TABS
1000.0000 mg | ORAL_TABLET | Freq: Once | ORAL | Status: AC
  Administered 2021-04-01: 1000 mg via ORAL

## 2021-04-01 MED ORDER — HEMOSTATIC AGENTS (NO CHARGE) OPTIME
TOPICAL | Status: DC | PRN
  Administered 2021-04-01: 1 via TOPICAL

## 2021-04-01 MED ORDER — FENTANYL CITRATE (PF) 100 MCG/2ML IJ SOLN
INTRAMUSCULAR | Status: AC
  Filled 2021-04-01: qty 2

## 2021-04-01 MED ORDER — PROMETHAZINE HCL 12.5 MG PO TABS: 12.5000 mg | ORAL_TABLET | Freq: Four times a day (QID) | ORAL | 0 refills | Status: AC | PRN

## 2021-04-01 MED ORDER — LIDOCAINE 2% (20 MG/ML) 5 ML SYRINGE
INTRAMUSCULAR | Status: DC | PRN
  Administered 2021-04-01: 40 mg via INTRAVENOUS

## 2021-04-01 MED ORDER — ONDANSETRON HCL 4 MG/2ML IJ SOLN
INTRAMUSCULAR | Status: AC
  Filled 2021-04-01: qty 2

## 2021-04-01 MED ORDER — MIDAZOLAM HCL 2 MG/2ML IJ SOLN
INTRAMUSCULAR | Status: DC | PRN
  Administered 2021-04-01: 2 mg via INTRAVENOUS

## 2021-04-01 SURGICAL SUPPLY — 30 items
APL SWBSTK 6 STRL LF DISP (MISCELLANEOUS) ×1
APPLICATOR COTTON TIP 6 STRL (MISCELLANEOUS) ×1 IMPLANT
APPLICATOR COTTON TIP 6IN STRL (MISCELLANEOUS) ×2
BLADE EXTENDED COATED 6.5IN (ELECTRODE) IMPLANT
BLADE SURG 11 STRL SS (BLADE) ×2 IMPLANT
CATH ROBINSON RED A/P 16FR (CATHETERS) ×2 IMPLANT
DRSG TELFA 3X8 NADH (GAUZE/BANDAGES/DRESSINGS) ×2 IMPLANT
ELECT BALL LEEP 5MM RED (ELECTRODE) ×2 IMPLANT
ELECT REM PT RETURN 9FT ADLT (ELECTROSURGICAL) ×2
ELECTRODE REM PT RTRN 9FT ADLT (ELECTROSURGICAL) ×1 IMPLANT
GAUZE 4X4 16PLY ~~LOC~~+RFID DBL (SPONGE) ×2 IMPLANT
GLOVE SURG ENC MOIS LTX SZ6.5 (GLOVE) ×2 IMPLANT
GLOVE SURG UNDER POLY LF SZ7 (GLOVE) ×2 IMPLANT
GOWN STRL REUS W/TWL LRG LVL3 (GOWN DISPOSABLE) ×2 IMPLANT
HEMOSTAT SURGICEL 2X14 (HEMOSTASIS) IMPLANT
KIT TURNOVER CYSTO (KITS) ×2 IMPLANT
NEEDLE SPNL 22GX3.5 QUINCKE BK (NEEDLE) ×2 IMPLANT
NS IRRIG 500ML POUR BTL (IV SOLUTION) ×2 IMPLANT
PACK VAGINAL WOMENS (CUSTOM PROCEDURE TRAY) ×2 IMPLANT
PAD OB MATERNITY 4.3X12.25 (PERSONAL CARE ITEMS) ×2 IMPLANT
PAD PREP 24X48 CUFFED NSTRL (MISCELLANEOUS) ×2 IMPLANT
SCOPETTES 8  STERILE (MISCELLANEOUS) ×4
SCOPETTES 8 STERILE (MISCELLANEOUS) ×2 IMPLANT
SPONGE SURGIFOAM ABS GEL 12-7 (HEMOSTASIS) ×2 IMPLANT
SUT ETHILON 3 0 PS 1 (SUTURE) ×2 IMPLANT
SUT VIC AB 2-0 CT1 (SUTURE) ×2 IMPLANT
SUT VIC AB 2-0 CT1 27 (SUTURE) ×2
SUT VIC AB 2-0 CT1 TAPERPNT 27 (SUTURE) ×1 IMPLANT
SYR BULB IRRIG 60ML STRL (SYRINGE) ×2 IMPLANT
SYR CONTROL 10ML LL (SYRINGE) ×2 IMPLANT

## 2021-04-01 NOTE — Transfer of Care (Signed)
Immediate Anesthesia Transfer of Care Note  Patient: Michele [name]  Procedure(s) Performed: Procedure(s) (LRB): CONIZATION CERVIX WITH BIOPSY/COLD KNIFE CONE (N/A)  Patient Location: PACU  Anesthesia Type: MAC  Level of Consciousness: awake, alert , oriented and patient cooperative  Airway & Oxygen Therapy: Patient Spontanous Breathing and Patient connected to face mask oxygen  Post-op Assessment: Report given to PACU RN and Post -op Vital signs reviewed and stable  Post vital signs: Reviewed and stable  Complications: No apparent anesthesia complications Last Vitals:  Vitals Value Taken Time  BP 99/53 04/01/21 1303  Temp    Pulse 73 04/01/21 1310  Resp 22 04/01/21 1310  SpO2 94 % 04/01/21 1310  Vitals shown include unvalidated device data.  Last Pain:  Vitals:   04/01/21 1046  TempSrc: Oral  PainSc: 4          Complications: No notable events documented.

## 2021-04-01 NOTE — Anesthesia Postprocedure Evaluation (Signed)
Anesthesia Post Note  Patient: Michele [name]  Procedure(s) Performed: CONIZATION CERVIX WITH BIOPSY/COLD KNIFE CONE (Vagina )     Patient location during evaluation: PACU Anesthesia Type: MAC Level of consciousness: awake Pain management: pain level controlled Vital Signs Assessment: post-procedure vital signs reviewed and stable Respiratory status: spontaneous breathing, nonlabored ventilation, respiratory function stable and patient connected to nasal cannula oxygen Cardiovascular status: stable and blood pressure returned to baseline Postop Assessment: no apparent nausea or vomiting Anesthetic complications: no   No notable events documented.  Last Vitals:  Vitals:   04/01/21 1315 04/01/21 1400  BP: 109/71 128/72  Pulse: 63 (!) 49  Resp: 18 16  Temp:  (!) 36.3 C  SpO2: 94% 100%    Last Pain:  Vitals:   04/01/21 1400  TempSrc:   PainSc: 0-No pain                 [name] P Rhylin Venters

## 2021-04-01 NOTE — H&P (Signed)
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 female [id number] scheduled for cold knife conization. Patient was referred by her  PCP to office for colposcopy due to ASCUS HR HPV positive on her pap smear dated: 12/01/2020. Patient notes a history of abnormal pap smear in the past but no history of cervical dysplasia. Patient had colposcopy on 01/28/21 and one cervical biopsy at 12 o'clock was positive for CIN 2/3. Patient denies abnormal uterine bleeding, no watery or abnormal discharge.   Pertinent Gynecological History: Menses: amenorrhea  Bleeding: none Contraception: none DES exposure: denies Blood transfusions: none Sexually transmitted diseases: past history: HPV Previous GYN Procedures:  c-section   Last mammogram:  patient never had   Last pap: abnormal: Ascus HR HPV positive  Date: 12/01/20 OB History: G6, P4   Menstrual History: Menarche age: 62 LMP: 08/31/20  Medical / Surgical History: Past medical history:  Past Medical History:  Diagnosis Date   Ectopic pregnancy, tubal 05/03/2008   PONV (postoperative nausea and vomiting)    Pre-diabetes     Past surgical history:  Past Surgical History:  Procedure Laterality Date   CESAREAN SECTION     ECTOPIC PREGNANCY SURGERY  05/03/2008   TONSILLECTOMY     Family History:  Family History  Problem Relation Age of Onset   Kidney disease Mother    Cancer Sister    Cancer Maternal Aunt    Anesthesia problems Neg Hx     Social History:  reports that she has never smoked. She has never used smokeless tobacco. She reports that she does not drink alcohol and does not use drugs.  Allergies: Allergies  Allergen Reactions   Bee Venom Anaphylaxis     Current Medications at time of admission:  Prior to Admission medications   Medication Sig Start Date End Date Taking?  Authorizing Provider  Vitamin D, Ergocalciferol, (DRISDOL) 1.25 MG (50000 UNIT) CAPS capsule Take 50,000 Units by mouth every 7 (seven) days.   Yes [provider]  cyclobenzaprine (FLEXERIL) 10 MG tablet Take 1 tablet (10 mg total) by mouth 2 (two) times daily as needed for muscle spasms. 04/15/17   Forde Dandy, MD  diclofenac sodium (VOLTAREN) 1 % GEL Apply 2 g topically 4 (four) times daily. 04/17/17   Emeline General, PA-C  ibuprofen (ADVIL,MOTRIN) 600 MG tablet Take 1 tablet (600 mg total) by mouth every 6 (six) hours as needed. 04/15/17   Forde Dandy, MD  naproxen (NAPROSYN) 500 MG tablet Take 1 tablet (500 mg total) by mouth 2 (two) times daily with a meal. 04/17/17   Emeline General, PA-C    Review of Systems: Constitutional: Negative   HENT: Negative   Eyes: Negative   Respiratory: Negative   Cardiovascular: Negative   Gastrointestinal: Negative  Genitourinary: Negative for vaginal bleeding   Musculoskeletal: Negative   Skin: Negative   Neurological: Negative   Endo/Heme/Allergies: Negative   Psychiatric/Behavioral: Negative      Objective:    Physical Exam: VS: Blood pressure 123/77, pulse 61, temperature 98.8 F (37.1 C), temperature source Oral, resp. rate 16, height 5' 1.5" (1.562 m), weight 69.7 kg, SpO2 99 %.  General:   alert, cooperative, and no distress  Skin:   normal  HEENT:  thyroid without masses  Lungs:  clear to auscultation bilaterally  Heart:   regular rate and rhythm, S1, S2 normal, no murmur, click, rub or gallop  Abdomen:  soft, non-tender; bowel sounds normal; no masses,  no organomegaly  Pelvis:  Exam deferred today. Previously done in office:   Uterus:  Normal size, mobile, anteverted   Adnexa:  No palpable masses, non-tender  Cervix:  Grossly normal in appearance   Labs / Imaging: Results for orders placed or performed during the hospital encounter of 04/01/21 (from the past 24 hour(s))  Pregnancy, urine POC     Status:  None   Collection Time: 04/01/21 10:22 AM  Result Value Ref Range   Preg Test, Ur NEGATIVE NEGATIVE   No results found.    Assessment:    Patient with diagnosis of  High grade dysplasia and discrepancy with pap smear and colposcopic biopsy.      Previously counseled patient about the diagnosis of CIN 2/3 and the implications. Discussed that high-grade squamous lesions (cervical intraepithelial neoplasia [CIN] 2 or 3) have a high risk of persisting or developing into cervical cancer over a period of years.  The ASCCP guidelines state that if a woman is 8 and older and not pregnant, CIN 2 or 3 is treated by removing or destroying the abnormal area.  Counseled patient that in an excisional procedure, the abnormal area on the surface of the cervix is cut out; excision can also remove abnormalities that extend inside the cervical opening.  Excisional therapy is recommended when the extent or type of cervical abnormality is not clear based on colposcopy and biopsy or when there is a severe abnormality. She was counseled that excision serves two purposes: It provides a sample of tissue to confirm the degree of an abnormality and check for cancerous or precancerous cells deep within the cervix The goal of excision is to remove the abnormality completely. If the edges of the tissue that is removed show evidence that some of the abnormality or precancer may have been left behind,                further treatment may be needed.    Plan:   On call to operating room for CKC (cold knife conization).  I had a lengthy discussion with the patient regarding her diagnosis. She was counseled about the procedure, risks, reasons, benefits and complications to include: injury to bowel, bladder, major blood vessel, ureter, bleeding, possibility of transfusion, infection, abnormal scar formation and the possibility of diagnosis of malignancy requiring further medical or surgical treatment. All the above was  reviewed in detail.  All inquiries made by patient were answered.  Consent was signed, witnessed and placed into chart. Post op Instructions were reviewed, including office follow up.      Sanjuana Kava MD 04/01/2021, 11:13 AM

## 2021-04-01 NOTE — Op Note (Signed)
Operative Note  Procedure(s): CONIZATION CERVIX WITH BIOPSY/COLD KNIFE CONE Procedure Note  BRINLYNN GORTON female 45 y.o.   DATE OF PROCEDURE: 04/01/2021  PREOPERATIVE DIAGNOSES: 1.  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  Cold knife cone biopsy. 2.  Endocervical curettage.   INDICATION: The patient was diagnosed with CIN II/III  on Colposcopy in the office.  Pap smear was ASCUS HR HPV +     SURGEON: Gervase Colberg, Andrez Grime, MD  ASSISTANT: None  ANESTHESIA: General LMA   ASA Class: 2  PROCEDURE DETAILS:  FINDINGS: EUA: Normal external vulva, normal vaginal vault.  Cervix appears grossly normal, parous. Uterus 10 weeks size mobile anteverted, moderate descent, no palpable masses. Adnexa: no palpable masses.   With Lugol's stain, decrease in uptake along transition zone in a circumferential manner from 12 to 6 o'clock entering endocervical canal.   Estimated Blood Loss:   25 mL         Drains:  Straight catheter to bladder         Total IV Fluids: 500 ml  Blood Given: none          Specimens: Cervical cone biopsy tagged at 12 o'clock , Endocervical curettings         Implants: none        Complications:  * No complications entered in OR log *         Disposition: PACU - hemodynamically stable.         Condition: good   DESCRIPTION OF PROCEDURE:  The patient was taken to the operating room and placed in the supine position on the operating table, where general anesthesia was administered. She was then placed in the dorsal lithotomy position where examination under anesthesia was performed. The patient was prepped and draped in the usual manner for surgery.  A weighted speculum was inserted into the vagina. The cervix was exposed with a  Sims retractor, and the anterior lip of the cervix was grasped with a single-toothed tenaculum. Angle sutures of 0 Vicryl were placed  at the 3 o'clock and 9 o'clock positions of the cervix in a figure-of-eight fashion. The cervix was infiltrated with Lidocaine with epinephrine. Approximately 10 mL of the solution was utilized. Cone biopsy was then excised with an 11 scalpel blade. After excision of the biopsy specimen, endocervical curettage was undertaken. The biopsy bed was treated with ball cautery to assure hemostasis.  Monsel's was placed and Gelfoam that was treated with lidocaine with epinephrine was placed in the cervical bed and the stay sutures tied together keeping it in place. The cervix was again inspected and hemostasis was good.  The procedure was then terminated.  All instruments were removed. The patient was returned to the supine position. The patient was awakened from anesthesia without any difficulty and transferred to the recovery room in good condition. The patient tolerated the procedure well.  Caffie Damme, MD 04/01/2021, 12:59 PM

## 2021-04-01 NOTE — Anesthesia Preprocedure Evaluation (Addendum)
Anesthesia Evaluation  Patient identified by MRN, date of birth, ID band Patient awake    Reviewed: Allergy & Precautions, NPO status , Patient's Chart, lab work & pertinent test results  History of Anesthesia Complications (+) PONV and history of anesthetic complications  Airway Mallampati: II  TM Distance: >3 FB Neck ROM: Full    Dental no notable dental hx.    Pulmonary neg pulmonary ROS,    Pulmonary exam normal breath sounds clear to auscultation       Cardiovascular negative cardio ROS Normal cardiovascular exam Rhythm:Regular Rate:Normal     Neuro/Psych negative neurological ROS  negative psych ROS   GI/Hepatic negative GI ROS, Neg liver ROS,   Endo/Other  negative endocrine ROS  Renal/GU negative Renal ROS     Musculoskeletal negative musculoskeletal ROS (+)   Abdominal   Peds  Hematology negative hematology ROS (+)   Anesthesia Other Findings Carcinoma in Situ of Cervix  Reproductive/Obstetrics hcg negative                            Anesthesia Physical Anesthesia Plan  ASA: 1  Anesthesia Plan: MAC   Post-op Pain Management:    Induction: Intravenous  PONV Risk Score and Plan: 4 or greater and Midazolam, Scopolamine patch - Pre-op, Ondansetron, Dexamethasone and Propofol infusion  Airway Management Planned: Simple Face Mask  Additional Equipment:   Intra-op Plan:   Post-operative Plan:   Informed Consent: I have reviewed the patients History and Physical, chart, labs and discussed the procedure including the risks, benefits and alternatives for the proposed anesthesia with the patient or authorized representative who has indicated his/her understanding and acceptance.     Dental advisory given  Plan Discussed with: CRNA  Anesthesia Plan Comments:        Anesthesia Quick Evaluation

## 2021-04-01 NOTE — Discharge Instructions (Signed)

## 2021-04-02 ENCOUNTER — Encounter (HOSPITAL_BASED_OUTPATIENT_CLINIC_OR_DEPARTMENT_OTHER): Payer: Self-pay | Admitting: Obstetrics & Gynecology

## 2021-04-03 LAB — SURGICAL PATHOLOGY

## 2021-05-26 ENCOUNTER — Ambulatory Visit
Admission: RE | Admit: 2021-05-26 | Discharge: 2021-05-26 | Disposition: A | Discharge status: Home / Self Care | Payer: Medicaid Other | Source: Ambulatory Visit | Attending: Endocrinology | Admitting: Endocrinology

## 2021-05-26 DIAGNOSIS — Z1231 Encounter for screening mammogram for malignant neoplasm of breast: Secondary | ICD-10-CM

## 2021-05-27 ENCOUNTER — Other Ambulatory Visit: Payer: Self-pay | Admitting: Endocrinology

## 2021-05-27 DIAGNOSIS — R928 Other abnormal and inconclusive findings on diagnostic imaging of breast: Secondary | ICD-10-CM

## 2021-06-19 ENCOUNTER — Ambulatory Visit
Admission: RE | Admit: 2021-06-19 | Discharge: 2021-06-19 | Disposition: A | Discharge status: Home / Self Care | Payer: Medicaid Other | Source: Ambulatory Visit | Attending: Endocrinology | Admitting: Endocrinology

## 2021-06-19 ENCOUNTER — Other Ambulatory Visit: Payer: Self-pay | Admitting: Endocrinology

## 2021-06-19 DIAGNOSIS — R921 Mammographic calcification found on diagnostic imaging of breast: Secondary | ICD-10-CM

## 2021-06-19 DIAGNOSIS — R928 Other abnormal and inconclusive findings on diagnostic imaging of breast: Secondary | ICD-10-CM

## 2021-12-18 ENCOUNTER — Ambulatory Visit
Admission: RE | Admit: 2021-12-18 | Discharge: 2021-12-18 | Disposition: A | Discharge status: Home / Self Care | Payer: Medicaid Other | Source: Ambulatory Visit | Attending: Endocrinology | Admitting: Endocrinology

## 2021-12-18 ENCOUNTER — Other Ambulatory Visit: Payer: Self-pay | Admitting: Endocrinology

## 2021-12-18 DIAGNOSIS — R921 Mammographic calcification found on diagnostic imaging of breast: Secondary | ICD-10-CM

## 2022-06-22 ENCOUNTER — Ambulatory Visit
Admission: RE | Admit: 2022-06-22 | Discharge: 2022-06-22 | Disposition: A | Discharge status: Home / Self Care | Payer: Medicaid Other | Source: Ambulatory Visit | Attending: Endocrinology | Admitting: Endocrinology

## 2022-06-22 DIAGNOSIS — R921 Mammographic calcification found on diagnostic imaging of breast: Secondary | ICD-10-CM

## 2022-11-09 ENCOUNTER — Other Ambulatory Visit: Payer: Self-pay | Admitting: Obstetrics and Gynecology

## 2022-11-17 ENCOUNTER — Telehealth: Payer: Self-pay

## 2022-11-17 NOTE — Telephone Encounter (Signed)
Unable to leave a message on [phone]

## 2023-07-02 IMAGING — MG MM DIGITAL DIAGNOSTIC UNILAT*L* W/ TOMO W/ CAD
8 series · 8 of 20 positions shown · non-contrast
Comparison: Baseline screening mammogram dated 05/26/2021.

CLINICAL DATA: Patient was recalled from screening mammogram for a
possible mass and calcifications in the left breast.

EXAM:
DIGITAL DIAGNOSTIC UNILATERAL LEFT MAMMOGRAM WITH TOMOSYNTHESIS AND
CAD; ULTRASOUND LEFT BREAST LIMITED
TECHNIQUE: Left digital diagnostic mammography and breast tomosynthesis was
performed. The images were evaluated with computer-aided detection.;
Targeted ultrasound examination of the left breast was performed.

[L CC]
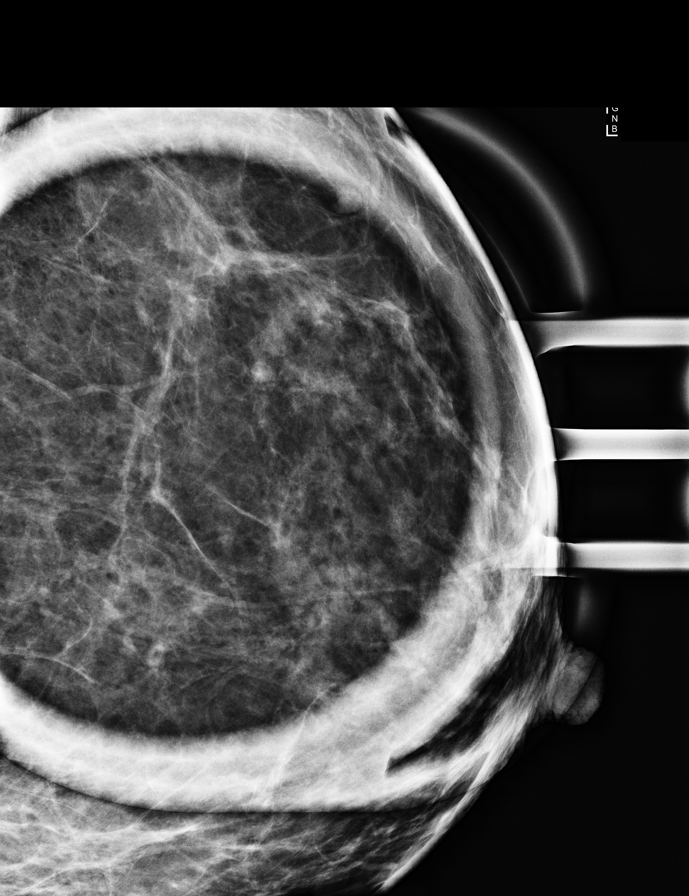

[L ML]
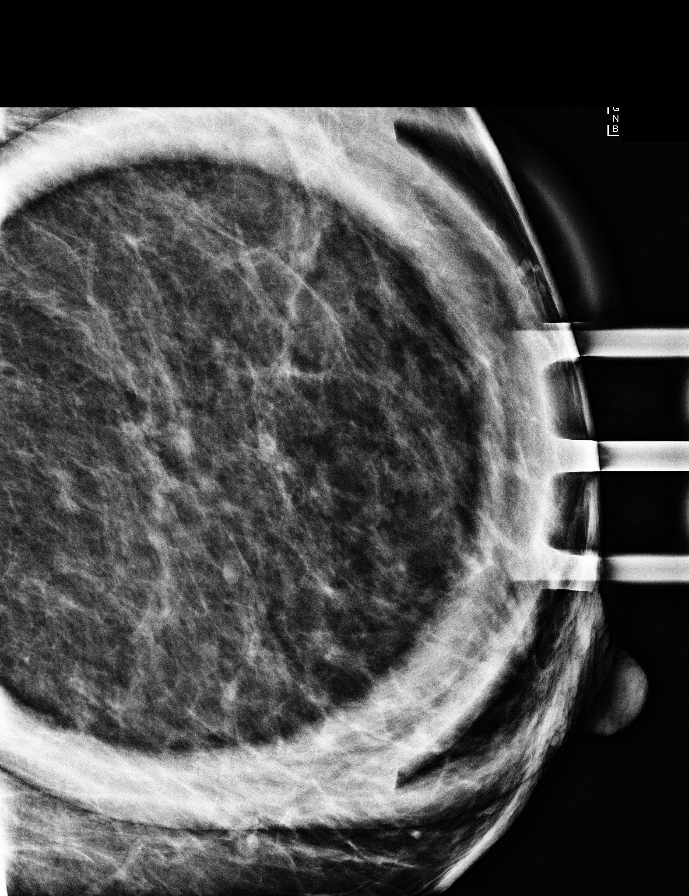

[L MLO synth-2D]
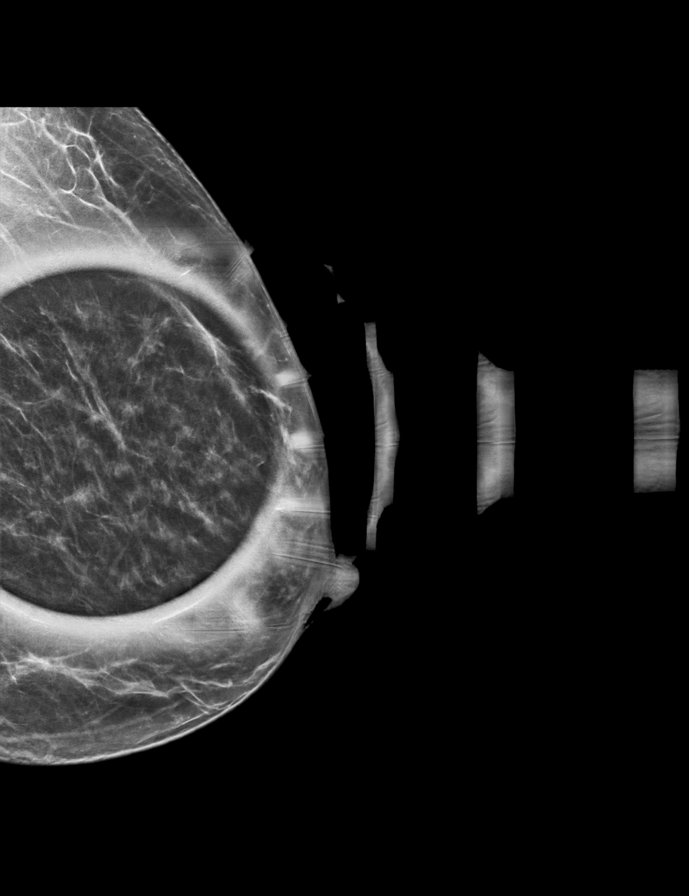

[L CC synth-2D]
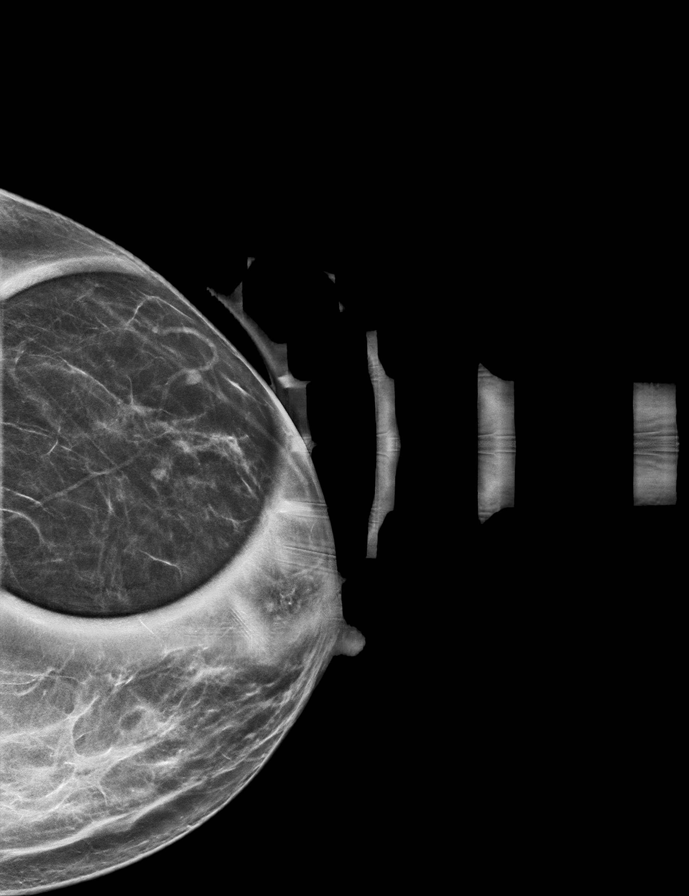

[L ML synth-2D]
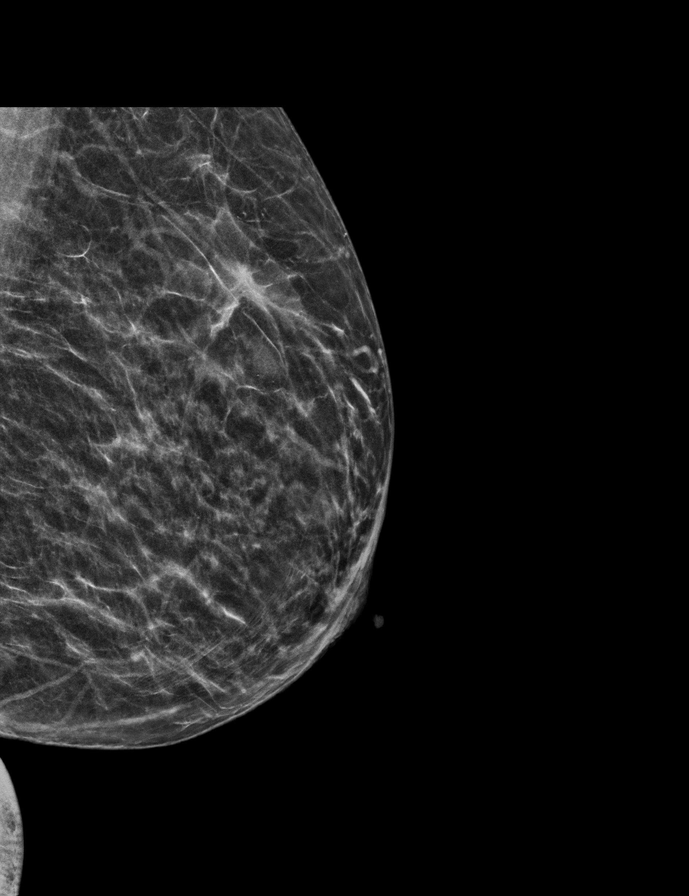

[L MLO tomo · tomo slice 27/53.0]
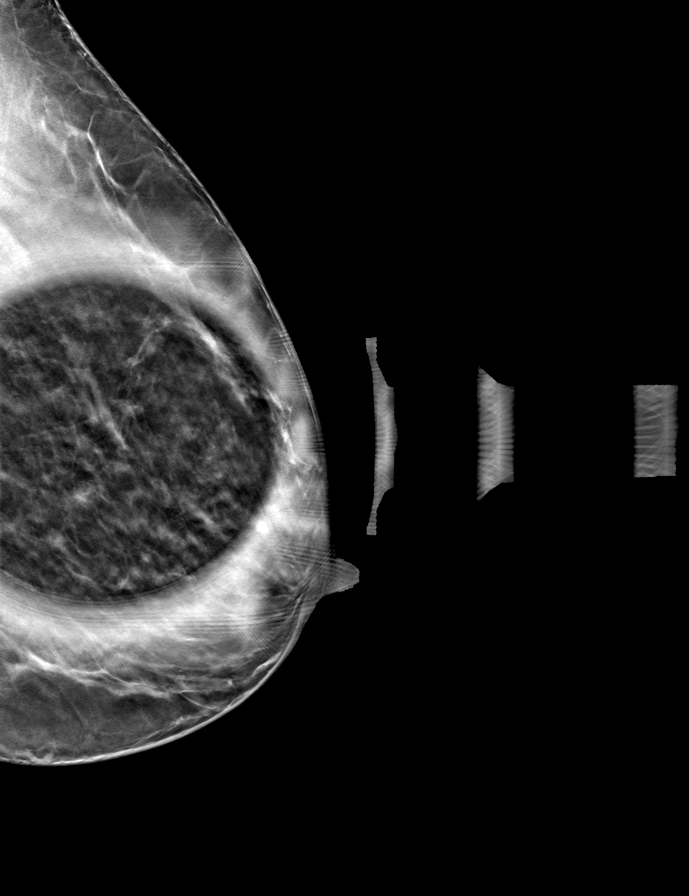

[L CC tomo · tomo slice 26/51.0]
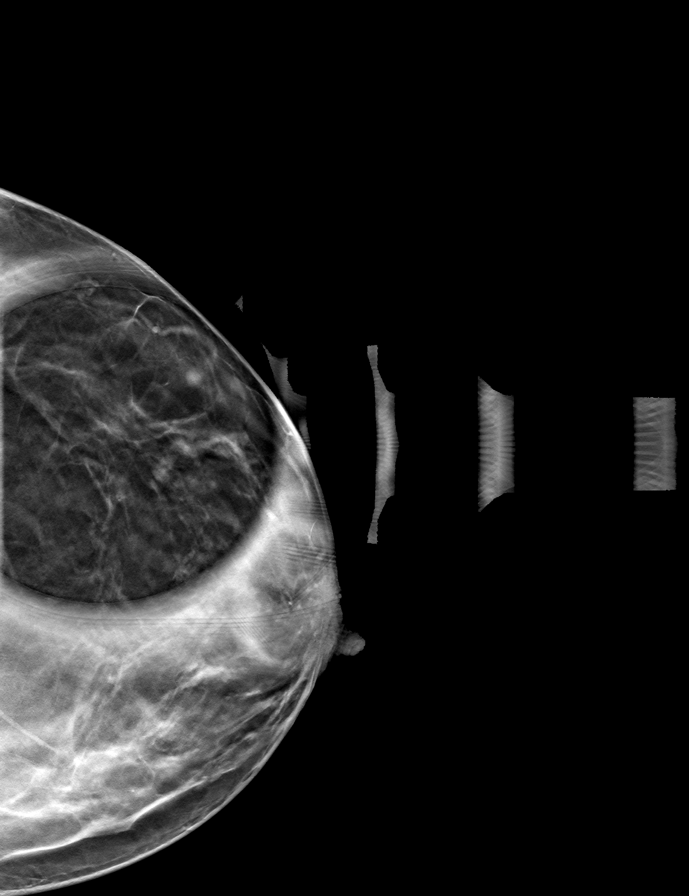

[L ML tomo · tomo slice 27/52.0]
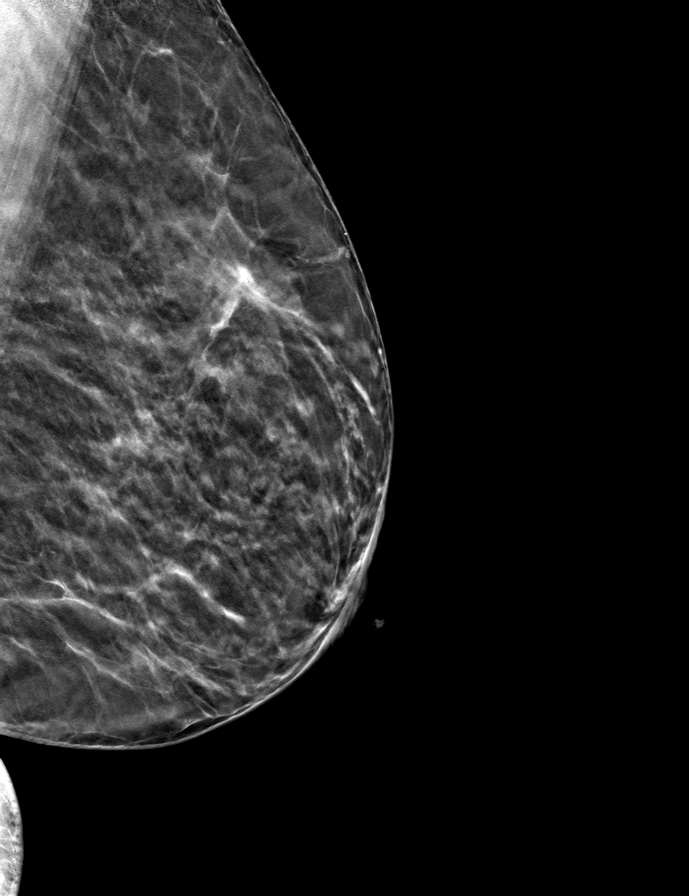

[8 of 20 positions shown; findings below may reference images not displayed]

ACR Breast Density Category b: There are scattered areas of
fibroglandular density.
FINDINGS: Additional imaging of the left breast was performed. There is
punctate grouped calcifications spanning an area of 5 mm in the
upper-outer quadrant of the left breast that are felt to likely be
benign. There are 2 circumscribed masses in the lateral aspect of
the left breast measuring 3 mm and 7 mm.

On physical exam, I do not palpate a mass in the lateral aspect of
the left breast.

Targeted ultrasound is performed, showing a benign intramammary
lymph node in the left breast at 3 o'clock 7 cm from the nipple
measuring 5 x 2 x 3 mm. There is a second benign-appearing
intramammary lymph node in the left breast at [DATE] 3 cm from the
nipple measuring 2 x 2 x 4 mm.
IMPRESSION: Probable benign calcifications in the left breast.

RECOMMENDATION:
Short-term interval follow-up left mammogram in 6 months is
recommended.

I have discussed the findings and recommendations with the patient.
If applicable, a reminder letter will be sent to the patient
regarding the next appointment.

BI-RADS CATEGORY  3: Probably benign.
